# Patient Record
Sex: Female | Born: 2000 | Race: Black or African American | Hispanic: No | Marital: Single | Smoking: Never smoker
Health system: Southern US, Community
[De-identification: ages and names within clinical notes are randomized; demographics above are authoritative.]

## PROBLEM LIST (undated history)

## (undated) DIAGNOSIS — J45909 Unspecified asthma, uncomplicated: Secondary | ICD-10-CM

## (undated) DIAGNOSIS — I10 Essential (primary) hypertension: Secondary | ICD-10-CM

## (undated) DIAGNOSIS — A4902 Methicillin resistant Staphylococcus aureus infection, unspecified site: Secondary | ICD-10-CM

## (undated) DIAGNOSIS — D649 Anemia, unspecified: Secondary | ICD-10-CM

## (undated) DIAGNOSIS — J4540 Moderate persistent asthma, uncomplicated: Secondary | ICD-10-CM

## (undated) DIAGNOSIS — D500 Iron deficiency anemia secondary to blood loss (chronic): Secondary | ICD-10-CM

## (undated) DIAGNOSIS — Q249 Congenital malformation of heart, unspecified: Secondary | ICD-10-CM

## (undated) HISTORY — DX: Anemia, unspecified: D64.9

## (undated) HISTORY — DX: Essential (primary) hypertension: I10

## (undated) HISTORY — DX: Iron deficiency anemia secondary to blood loss (chronic): D50.0

## (undated) HISTORY — DX: Moderate persistent asthma, uncomplicated: J45.40

---
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
## 2020-01-19 ENCOUNTER — Other Ambulatory Visit: Payer: Self-pay

## 2020-01-19 ENCOUNTER — Encounter (HOSPITAL_COMMUNITY): Payer: Self-pay

## 2020-01-19 ENCOUNTER — Ambulatory Visit (HOSPITAL_COMMUNITY)
Admission: EM | Admit: 2020-01-19 | Discharge: 2020-01-19 | Disposition: A | Discharge status: Home / Self Care | Payer: Medicaid Other

## 2020-01-19 ENCOUNTER — Ambulatory Visit (INDEPENDENT_AMBULATORY_CARE_PROVIDER_SITE_OTHER): Payer: Medicaid Other

## 2020-01-19 DIAGNOSIS — M79644 Pain in right finger(s): Secondary | ICD-10-CM

## 2020-01-19 DIAGNOSIS — S6991XA Unspecified injury of right wrist, hand and finger(s), initial encounter: Secondary | ICD-10-CM

## 2020-01-19 DIAGNOSIS — S60111A Contusion of right thumb with damage to nail, initial encounter: Secondary | ICD-10-CM

## 2020-01-19 NOTE — ED Triage Notes (Signed)
Pt presents to UC with swelling and pain in the right thumb, black nail in the right thumb x 1 day, after she smashed the finger with a door.

## 2020-01-19 NOTE — ED Provider Notes (Signed)
[facility]    CSN: [id number] Arrival date & time: 01/19/20  1118      History   Chief Complaint Chief Complaint  Patient presents with  . Finger Injury    HPI Mackenzie Berger is a 19 y.o. female.   Patient reports for evaluation of right thumb injury.  She reports yesterday she was loading groceries into her car and she slammed car door all the end of her right thumb.  She reports since then has been swelling and discoloration of her nail.  She reports has been quite painful.  She denies ability to move the thumb due to pain.  She reports pain has been starting to move proximal in the thumb.  Denies pain elsewhere in her hand.  Assures me that only the end of the right thumb was caught on the door and then no other parts of her hand were in the door.  Denies any numbness or tingling in thumb.  No previous injuries.  This occurred around 3 PM on 01/18/2020.     Past Medical History:  Diagnosis Date  . Asthma   . 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  Marland Kitchen MRSA (methicillin resistant Staphylococcus aureus)     Patient Active Problem List   Diagnosis Date Noted  . Pelvic pain in female 09/12/2019  . Abnormal uterine bleeding (AUB) 01/09/2018  . Iron deficiency anemia due to chronic blood loss 03/11/2017  . Heavy menses 03/11/2017  . Syncope 03/11/2017  . Anemia 03/08/2017    History reviewed. No pertinent surgical history.  OB History    Gravida  0   Para  0   Term  0   Preterm  0   AB  0   Living  0     SAB  0   TAB  0   Ectopic  0   Multiple  0   Live Births  0            Home Medications    Prior to Admission medications   Medication Sig Start Date End Date Taking? Authorizing Provider  montelukast (SINGULAIR) 10 MG tablet Take 10 mg by mouth at bedtime.   Yes [provider]    albuterol (PROVENTIL HFA;VENTOLIN HFA) 108 (90 Base) MCG/ACT inhaler Inhale 2 puffs into the lungs every 6 (six) hours as needed for wheezing or shortness of breath.    [provider]  albuterol (PROVENTIL) (2.5 MG/3ML) 0.083% nebulizer solution Take 2.5 mg by nebulization every 6 (six) hours as needed for wheezing or shortness of breath.    [provider]  cetirizine (ZYRTEC ALLERGY) 10 MG tablet Take 1 tablet (10 mg total) by mouth daily as needed for allergies or rhinitis. 12/02/19   Petrucelli, Pleas Koch, PA-C  Elagolix Sodium (ORILISSA) 150 MG TABS Take 1 tablet by mouth every morning. Patient taking differently: Take 1 tablet by mouth daily.  09/12/19   Adam Phenix, MD  ferrous sulfate 325 (65 FE) MG tablet Take 1 tablet (325 mg total) by mouth 3 (three) times daily with meals. 03/11/17   Marca Ancona, MD  IBU 600 MG tablet TAKE ONE (1) TABLET BY MOUTH EVERY 6 HOURS AS NEEDED Patient taking differently: Take 600 mg by mouth every 6 (six) hours as needed for fever, headache or mild pain.  07/01/17   Adam Phenix, MD  Olopatadine HCl 0.2 % SOLN Place 1 drop into both eyes daily as needed (for allergies).  11/19/19   [provider]  predniSONE (STERAPRED UNI-PAK 21 TAB) 10 MG (21) TBPK tablet Take by mouth daily. 6,5,4,3,2,1 12/02/19   Petrucelli, Samantha R, PA-C  Norethindrone Acetate-Ethinyl Estradiol (LOESTRIN 1.5/30, 21,) 1.5-30 MG-MCG tablet Take 1 tablet by mouth daily. Patient not taking: Reported on 09/12/2019 01/09/18 12/02/19  Orvilla Cornwall A, CNM  Norgestimate-Ethinyl Estradiol Triphasic (TRI-SPRINTEC) 0.18/0.215/0.25 MG-35 MCG tablet Take 3 tablets by mouth daily for 3 days. Then, take 2 tablets daily for 3 days. Then, take 1 tablet daily until you get to the fourth row of the medicine. Do not take any of the tablets on the last row of the medication. Patient not taking: Reported on 12/02/2019 07/20/19 12/02/19  Barkley Boards, PA-C    Family  History Family History  Problem Relation Age of Onset  . Asthma Mother   . Diabetes Mother     Social History Social History   Tobacco Use  . Smoking status: Passive Smoke Exposure - Never Smoker  . Smokeless tobacco: Never Used  . Tobacco comment: mother and brothers smoke in the home  Vaping Use  . Vaping Use: Never used  Substance Use Topics  . Alcohol use: No  . Drug use: No     Allergies   Patient has no known allergies.   Review of Systems Review of Systems   Physical Exam Triage Vital Signs ED Triage Vitals  Enc Vitals Group     BP 01/19/20 1130 136/74     Pulse Rate 01/19/20 1130 86     Resp 01/19/20 1130 18     Temp 01/19/20 1130 98.6 F (37 C)     Temp Source 01/19/20 1130 Oral     SpO2 01/19/20 1130 99 %     Weight --      Height --      Head Circumference --      Peak Flow --      Pain Score 01/19/20 1132 10     Pain Loc --      Pain Edu? --      Excl. in GC? --    No data found.  Updated Vital Signs BP 136/74 (BP Location: Right Arm)   Pulse 86   Temp 98.6 F (37 C) (Oral)   Resp 18   LMP 01/05/2020 (Exact Date) Comment: 2 weeks  SpO2 99%   Visual Acuity Right Eye Distance:   Left Eye Distance:   Bilateral Distance:    Right Eye Near:   Left Eye Near:    Bilateral Near:     Physical Exam Vitals and nursing note reviewed.  Constitutional:      General: She is not in acute distress.    Appearance: Normal appearance.  Musculoskeletal:     Comments: Right thumb with subungual hematoma.  There is swelling at the DIP.  Tenderness to palpation throughout the distal phalanx.  Some tenderness in the proximal phalanx.  No significant tenderness over the first MCP joint.  Patient able to move the MCP joint however pain limits range of motion of the distal phalangeal joint.  There is no bony tenderness in the proximal hand or wrist.  No metacarpal tenderness throughout.  No tenderness in the other digits.  Patient able to make a fist  minus her thumb in the right hand.  Cap refill less than  2 seconds sensation intact in the right thumb.  Skin:    General: Skin is warm and dry.  Neurological:     Mental Status: She is alert.      UC Treatments / Results  Labs (all labs ordered are listed, but only abnormal results are displayed) Labs Reviewed - No data to display  EKG   Radiology DG Finger Thumb Right  Result Date: 01/19/2020 CLINICAL DATA:  Pain after trauma EXAM: RIGHT THUMB 2+V COMPARISON:  None. FINDINGS: There is no evidence of fracture or dislocation. There is no evidence of arthropathy or other focal bone abnormality. Soft tissues are unremarkable. IMPRESSION: Negative. Electronically Signed   By: Dorise Bullion III M.D   On: 01/19/2020 13:23    Procedures Procedures (including critical care time) Subungual hematoma drained with electrocautery pen.  2 holes made adjacent to one another on the right thumb.  Good drainage.  Patient tolerated this well.  Nail was cleansed with iodine prior to procedure.  Medications Ordered in UC Medications - No data to display  Initial Impression / Assessment and Plan / UC Course  I have reviewed the triage vital signs and the nursing notes.  Pertinent labs & imaging results that were available during my care of the patient were reviewed by me and considered in my medical decision making (see chart for details).     #Subungual hematoma #Right thumb injury Patient is a 19 year old presenting with right thumb injury and a subungual hematoma on the right thumb.  Successful trephination.  No acute fracture visible on x-ray however given mechanism and limited range of motion today will place in a thumb spica both for protection as well as splinting.  We will have her follow-up with sports medicine next week for reevaluation to ensure there is no ligamentous or delayed findings of fractures.  Discussed ibuprofen and Tylenol for pain management.  Patient verbalized  understanding the plan.   Final Clinical Impressions(s) / UC Diagnoses   Final diagnoses:  Subungual hematoma of right thumb, initial encounter  Injury of right thumb, initial encounter     Discharge Instructions     Use the brace to protect the thumb and for comfort  Schedule follow up with Sports medicine in 1 week for re-evaluation  take 2 regular strength Advil/ibuprofen and 2 regular strength Tylenol every 6-8 hours for pain.  You may ice the thumb as well.      ED Prescriptions    None     PDMP not reviewed this encounter.   Purnell Shoemaker, PA-C 01/19/20 1932

## 2020-01-19 NOTE — Discharge Instructions (Addendum)
Use the brace to protect the thumb and for comfort  Schedule follow up with Sports medicine in 1 week for re-evaluation  take 2 regular strength Advil/ibuprofen and 2 regular strength Tylenol every 6-8 hours for pain.  You may ice the thumb as well.

## 2020-02-01 ENCOUNTER — Ambulatory Visit: Payer: Medicaid Other | Admitting: Family Medicine

## 2020-02-01 ENCOUNTER — Other Ambulatory Visit: Payer: Self-pay

## 2020-04-20 ENCOUNTER — Other Ambulatory Visit: Payer: Self-pay | Admitting: Obstetrics & Gynecology

## 2020-04-20 DIAGNOSIS — N920 Excessive and frequent menstruation with regular cycle: Secondary | ICD-10-CM

## 2020-07-17 ENCOUNTER — Telehealth: Payer: Self-pay

## 2020-07-17 NOTE — Telephone Encounter (Signed)
Returned call, pt wanting refill on Orilissa, advised that per provider note from Feb 2021 she was suppose to return to office in 3 months, advised pt that I will send message to provider for review but she needs to schedule an appointment.

## 2020-07-18 ENCOUNTER — Other Ambulatory Visit: Payer: Self-pay | Admitting: Obstetrics & Gynecology

## 2020-07-18 DIAGNOSIS — N920 Excessive and frequent menstruation with regular cycle: Secondary | ICD-10-CM

## 2020-07-18 MED ORDER — ORILISSA 150 MG PO TABS: 1.0000 | ORAL_TABLET | Freq: Every day | ORAL | 1 refills | Status: DC

## 2020-07-18 NOTE — Progress Notes (Signed)
Meds ordered this encounter  Medications  . Elagolix Sodium (ORILISSA) 150 MG TABS    Sig: Take 1 tablet by mouth daily.    Dispense:  30 tablet    Refill:  1

## 2021-01-22 ENCOUNTER — Other Ambulatory Visit: Payer: Self-pay

## 2021-01-22 ENCOUNTER — Ambulatory Visit (HOSPITAL_COMMUNITY)
Admission: EM | Admit: 2021-01-22 | Discharge: 2021-01-22 | Disposition: A | Discharge status: Home / Self Care | Payer: Medicaid Other | Attending: Physician Assistant | Admitting: Physician Assistant

## 2021-01-22 ENCOUNTER — Encounter (HOSPITAL_COMMUNITY): Payer: Self-pay

## 2021-01-22 DIAGNOSIS — R0602 Shortness of breath: Secondary | ICD-10-CM

## 2021-01-22 DIAGNOSIS — J4521 Mild intermittent asthma with (acute) exacerbation: Secondary | ICD-10-CM | POA: Diagnosis not present

## 2021-01-22 DIAGNOSIS — R059 Cough, unspecified: Secondary | ICD-10-CM

## 2021-01-22 MED ORDER — BENZONATATE 100 MG PO CAPS: 100.0000 mg | ORAL_CAPSULE | Freq: Three times a day (TID) | ORAL | 0 refills | Status: DC

## 2021-01-22 MED ORDER — FLUTICASONE-SALMETEROL 250-50 MCG/ACT IN AEPB: 1.0000 | INHALATION_SPRAY | Freq: Two times a day (BID) | RESPIRATORY_TRACT | 0 refills | Status: AC

## 2021-01-22 MED ORDER — PREDNISONE 10 MG (21) PO TBPK: ORAL_TABLET | ORAL | 0 refills | Status: DC

## 2021-01-22 NOTE — ED Triage Notes (Signed)
Patient presents to Urgent Care with complaints of asthma flare-up and productive cough x 3 weeks ago. Pt treating her wheezing and SOB with her albuterol with no relief. She was dx with COVID a month ago.   Denies fever.

## 2021-01-22 NOTE — Discharge Instructions (Signed)
We are not starting any antibiotics today.  Take prednisone as directed.  Do not take NSAIDs including aspirin, ibuprofen/Advil, naproxen/Aleve with prednisone as it can cause stomach bleeding.  Use Advair (fluticasone/salmeterol) twice a day.  Make sure to rinse your mouth following use of this medication.  Use your albuterol inhaler as needed for acute symptoms.  If you have any worsening symptoms please return for reevaluation.  Follow-up with your PCP as scheduled next week.

## 2021-01-22 NOTE — ED Provider Notes (Signed)
[facility]    CSN: [id number] Arrival date & time: 01/22/21  1315      History   Chief Complaint Chief Complaint  Patient presents with   Asthma   Cough    HPI Mackenzie Berger is a 20 y.o. female.   Patient presents today with a several week history of worsening shortness of breath and cough.  She has a history of asthma and has been using albuterol inhaler more frequently prompting evaluation.  Reports that she tested positive for COVID approximately 1 month ago and has had resolution of symptoms with the exception of ongoing asthma symptoms.  She does report cough but denies any significant sputum production.  She has not tried any over-the-counter medications for symptom management.  She denies taking maintenance medication in the past.  She was given a prednisone taper immediately after being diagnosed with COVID which provided temporary relief of symptoms.  Denies any additional steroid use since that time.  She denies history of diabetes.  She does not smoke cigarettes.  She has been hospitalized for asthma in the past but denies previous intubation.  Reports symptoms are causing her to wake up at night and preventing her from performing daily activities.   Past Medical History:  Diagnosis Date   Asthma    Heart abnormality    per mom at times her heart will race-when she was born she had be on digoxin-lately it is bothering her more-we are from [place] and don't have a cardiologist yet.   MRSA (methicillin resistant Staphylococcus aureus)     Patient Active Problem List   Diagnosis Date Noted   Pelvic pain in female 09/12/2019   Abnormal uterine bleeding (AUB) 01/09/2018   Iron deficiency anemia due to chronic blood loss 03/11/2017   Heavy menses 03/11/2017   Syncope 03/11/2017   Anemia 03/08/2017    History reviewed. No pertinent surgical history.  OB History     Gravida  0   Para  0   Term  0   Preterm  0   AB  0   Living  0      SAB  0    IAB  0   Ectopic  0   Multiple  0   Live Births  0            Home Medications    Prior to Admission medications   Medication Sig Start Date End Date Taking? Authorizing Provider  benzonatate (TESSALON) 100 MG capsule Take 1 capsule (100 mg total) by mouth every 8 (eight) hours. 01/22/21  Yes Bernice Mcauliffe K, PA-C  fluticasone-salmeterol (ADVAIR) 250-50 MCG/ACT AEPB Inhale 1 puff into the lungs in the morning and at bedtime. 01/22/21  Yes Soren Pigman K, PA-C  predniSONE (STERAPRED UNI-PAK 21 TAB) 10 MG (21) TBPK tablet As directed 01/22/21  Yes Aeliana Spates K, PA-C  albuterol (PROVENTIL HFA;VENTOLIN HFA) 108 (90 Base) MCG/ACT inhaler Inhale 2 puffs into the lungs every 6 (six) hours as needed for wheezing or shortness of breath.    [provider]  albuterol (PROVENTIL) (2.5 MG/3ML) 0.083% nebulizer solution Take 2.5 mg by nebulization every 6 (six) hours as needed for wheezing or shortness of breath.    [provider]  cetirizine (ZYRTEC ALLERGY) 10 MG tablet Take 1 tablet (10 mg total) by mouth daily as needed for allergies or rhinitis. 12/02/19   Petrucelli, Pleas Koch, PA-C  Elagolix Sodium (ORILISSA) 150 MG TABS Take 1 tablet by mouth daily. 07/18/20  Adam Phenix, MD  ferrous sulfate 325 (65 FE) MG tablet Take 1 tablet (325 mg total) by mouth 3 (three) times daily with meals. 03/11/17   Marca Ancona, MD  IBU 600 MG tablet TAKE ONE (1) TABLET BY MOUTH EVERY 6 HOURS AS NEEDED Patient taking differently: Take 600 mg by mouth every 6 (six) hours as needed for fever, headache or mild pain.  07/01/17   Adam Phenix, MD  montelukast (SINGULAIR) 10 MG tablet Take 10 mg by mouth at bedtime.    [provider]  Olopatadine HCl 0.2 % SOLN Place 1 drop into both eyes daily as needed (for allergies).  11/19/19   [provider]  Norethindrone Acetate-Ethinyl Estradiol (LOESTRIN 1.5/30, 21,) 1.5-30 MG-MCG tablet Take 1 tablet by mouth daily. Patient  not taking: Reported on 09/12/2019 01/09/18 12/02/19  Orvilla Cornwall A, CNM  Norgestimate-Ethinyl Estradiol Triphasic (TRI-SPRINTEC) 0.18/0.215/0.25 MG-35 MCG tablet Take 3 tablets by mouth daily for 3 days. Then, take 2 tablets daily for 3 days. Then, take 1 tablet daily until you get to the fourth row of the medicine. Do not take any of the tablets on the last row of the medication. Patient not taking: Reported on 12/02/2019 07/20/19 12/02/19  Barkley Boards, PA-C    Family History Family History  Problem Relation Age of Onset   Asthma Mother    Diabetes Mother     Social History Social History   Tobacco Use   Smoking status: Passive Smoke Exposure - Never Smoker   Smokeless tobacco: Never   Tobacco comments:    mother and brothers smoke in the home  Vaping Use   Vaping Use: Never used  Substance Use Topics   Alcohol use: No   Drug use: No     Allergies   Patient has no known allergies.   Review of Systems Review of Systems  Constitutional:  Positive for activity change. Negative for appetite change, fatigue and fever.  HENT:  Negative for congestion, sinus pressure, sneezing and sore throat.   Respiratory:  Positive for cough, chest tightness, shortness of breath and wheezing.   Cardiovascular:  Negative for chest pain.  Gastrointestinal:  Negative for abdominal pain, diarrhea, nausea and vomiting.  Neurological:  Negative for dizziness, light-headedness and headaches.    Physical Exam Triage Vital Signs ED Triage Vitals  Enc Vitals Group     BP 01/22/21 1425 136/89     Pulse Rate 01/22/21 1425 82     Resp 01/22/21 1425 16     Temp 01/22/21 1425 99 F (37.2 C)     Temp Source 01/22/21 1425 Oral     SpO2 01/22/21 1425 96 %     Weight --      Height --      Head Circumference --      Peak Flow --      Pain Score 01/22/21 1427 0     Pain Loc --      Pain Edu? --      Excl. in [place]? --    No data found.  Updated Vital Signs BP 136/89 (BP Location: Right Arm)    Pulse 82   Temp 99 F (37.2 C) (Oral)   Resp 16   LMP 01/15/2021   SpO2 96%   Visual Acuity Right Eye Distance:   Left Eye Distance:   Bilateral Distance:    Right Eye Near:   Left Eye Near:    Bilateral Near:  Physical Exam Vitals reviewed.  Constitutional:      General: She is awake. She is not in acute distress.    Appearance: Normal appearance. She is normal weight. She is not ill-appearing.     Comments: Very pleasant female appears stated age no acute distress  HENT:     Head: Normocephalic and atraumatic.     Right Ear: Tympanic membrane, ear canal and external ear normal. Tympanic membrane is not erythematous or bulging.     Left Ear: Tympanic membrane, ear canal and external ear normal. Tympanic membrane is not erythematous or bulging.     Nose:     Right Sinus: No maxillary sinus tenderness or frontal sinus tenderness.     Left Sinus: No maxillary sinus tenderness or frontal sinus tenderness.     Mouth/Throat:     Pharynx: Uvula midline. No oropharyngeal exudate or posterior oropharyngeal erythema.  Cardiovascular:     Rate and Rhythm: Normal rate and regular rhythm.     Heart sounds: Normal heart sounds, S1 normal and S2 normal. No murmur heard. Pulmonary:     Effort: Pulmonary effort is normal.     Breath sounds: Wheezing present. No rhonchi or rales.     Comments: Widespread wheezing throughout lung fields. Lymphadenopathy:     Head:     Right side of head: No submental, submandibular or tonsillar adenopathy.     Left side of head: No submental, submandibular or tonsillar adenopathy.     Cervical: No cervical adenopathy.  Psychiatric:        Behavior: Behavior is cooperative.     UC Treatments / Results  Labs (all labs ordered are listed, but only abnormal results are displayed) Labs Reviewed - No data to display  EKG   Radiology No results found.  Procedures Procedures (including critical care time)  Medications Ordered in  UC Medications - No data to display  Initial Impression / Assessment and Plan / UC Course  I have reviewed the triage vital signs and the nursing notes.  Pertinent labs & imaging results that were available during my care of the patient were reviewed by me and considered in my medical decision making (see chart for details).      No evidence of acute infection that would warrant initiation of antibiotics.  Concern for asthma flare given persistent symptoms despite regular use of rescue medication.  Patient was offered steroid injection but declined this due to fear of needles.  She was started on prednisone taper with instruction not to take NSAIDs with this medication due to risk of GI bleeding.  She was prescribed Tessalon for cough as needed.  She was prescribed maintenance medication of Advair with instruction to rinse her mouth following use of this medication to prevent thrush.  She is to continue her allergy medication as previously prescribed.  She has follow-up scheduled with PCP next week, strongly encouraged to keep this appointment.  Discussed alarm symptoms or warrant emergent evaluation.  Strict return precautions given to which patient expressed understanding.  Final Clinical Impressions(s) / UC Diagnoses   Final diagnoses:  Mild intermittent asthma with acute exacerbation  Shortness of breath  Cough     Discharge Instructions      We are not starting any antibiotics today.  Take prednisone as directed.  Do not take NSAIDs including aspirin, ibuprofen/Advil, naproxen/Aleve with prednisone as it can cause stomach bleeding.  Use Advair (fluticasone/salmeterol) twice a day.  Make sure to rinse your mouth following use of  this medication.  Use your albuterol inhaler as needed for acute symptoms.  If you have any worsening symptoms please return for reevaluation.  Follow-up with your PCP as scheduled next week.     ED Prescriptions     Medication Sig Dispense Auth. Provider    predniSONE (STERAPRED UNI-PAK 21 TAB) 10 MG (21) TBPK tablet As directed 21 tablet Aven Christen K, PA-C   benzonatate (TESSALON) 100 MG capsule Take 1 capsule (100 mg total) by mouth every 8 (eight) hours. 21 capsule Maille Halliwell K, PA-C   fluticasone-salmeterol (ADVAIR) 250-50 MCG/ACT AEPB Inhale 1 puff into the lungs in the morning and at bedtime. 60 each Jasime Westergren, Noberto RetortErin K, PA-C      PDMP not reviewed this encounter.   Jeani HawkingRaspet, Yumiko Alkins K, PA-C 01/22/21 1445

## 2021-03-11 ENCOUNTER — Ambulatory Visit (INDEPENDENT_AMBULATORY_CARE_PROVIDER_SITE_OTHER): Payer: Medicaid Other | Admitting: Obstetrics and Gynecology

## 2021-03-11 ENCOUNTER — Encounter: Payer: Self-pay | Admitting: Obstetrics and Gynecology

## 2021-03-11 ENCOUNTER — Other Ambulatory Visit: Payer: Self-pay

## 2021-03-11 VITALS — BP 143/78 | HR 89 | Ht 63.0 in | Wt 182.0 lb

## 2021-03-11 DIAGNOSIS — N920 Excessive and frequent menstruation with regular cycle: Secondary | ICD-10-CM

## 2021-03-11 DIAGNOSIS — Z01419 Encounter for gynecological examination (general) (routine) without abnormal findings: Secondary | ICD-10-CM | POA: Diagnosis not present

## 2021-03-11 NOTE — Progress Notes (Signed)
GYNECOLOGY ANNUAL PREVENTATIVE CARE ENCOUNTER NOTE  History:     Mackenzie Berger is a 20 y.o. G0P0000 female here for a routine annual gynecologic exam.  Current complaints: none.   Denies abnormal vaginal bleeding, discharge, pelvic pain, problems with intercourse or other gynecologic concerns.  Pt notes regular menses lasting 7 days.  Menses heavy 4-5 days.  Pt had hx of heavy bleeding, she states this has somewhat improved.  She declines STD testing today.  Pt starting college in a few weeks.   Gynecologic History Patient's last menstrual period was 03/03/2021. Contraception: none Last Pap: n/a. Due to age  Obstetric History OB History  Gravida Para Term Preterm AB Living  0 0 0 0 0 0  SAB IAB Ectopic Multiple Live Births  0 0 0 0 0    Past Medical History:  Diagnosis Date   Asthma    Heart abnormality    per mom at times her heart will race-when she was born she had be on digoxin-lately it is bothering her more-we are from [place] and don't have a cardiologist yet.   MRSA (methicillin resistant Staphylococcus aureus)     History reviewed. No pertinent surgical history.  Current Outpatient Medications on File Prior to Visit  Medication Sig Dispense Refill   albuterol (PROVENTIL) (2.5 MG/3ML) 0.083% nebulizer solution Take 2.5 mg by nebulization every 6 (six) hours as needed for wheezing or shortness of breath.     cetirizine (ZYRTEC ALLERGY) 10 MG tablet Take 1 tablet (10 mg total) by mouth daily as needed for allergies or rhinitis. 15 tablet 0   ferrous sulfate 325 (65 FE) MG tablet Take 1 tablet (325 mg total) by mouth 3 (three) times daily with meals. 90 tablet 3   fluticasone-salmeterol (ADVAIR) 250-50 MCG/ACT AEPB Inhale 1 puff into the lungs in the morning and at bedtime. 60 each 0   IBU 600 MG tablet TAKE ONE (1) TABLET BY MOUTH EVERY 6 HOURS AS NEEDED (Patient taking differently: Take 600 mg by mouth every 6 (six) hours as needed for fever, headache or mild pain.) 30  tablet 1   albuterol (PROVENTIL HFA;VENTOLIN HFA) 108 (90 Base) MCG/ACT inhaler Inhale 2 puffs into the lungs every 6 (six) hours as needed for wheezing or shortness of breath.     benzonatate (TESSALON) 100 MG capsule Take 1 capsule (100 mg total) by mouth every 8 (eight) hours. 21 capsule 0   Elagolix Sodium (ORILISSA) 150 MG TABS Take 1 tablet by mouth daily. 30 tablet 1   montelukast (SINGULAIR) 10 MG tablet Take 10 mg by mouth at bedtime.     Olopatadine HCl 0.2 % SOLN Place 1 drop into both eyes daily as needed (for allergies).      [DISCONTINUED] Norethindrone Acetate-Ethinyl Estradiol (LOESTRIN 1.5/30, 21,) 1.5-30 MG-MCG tablet Take 1 tablet by mouth daily. (Patient not taking: Reported on 09/12/2019) 1 Package 11   [DISCONTINUED] Norgestimate-Ethinyl Estradiol Triphasic (TRI-SPRINTEC) 0.18/0.215/0.25 MG-35 MCG tablet Take 3 tablets by mouth daily for 3 days. Then, take 2 tablets daily for 3 days. Then, take 1 tablet daily until you get to the fourth row of the medicine. Do not take any of the tablets on the last row of the medication. (Patient not taking: Reported on 12/02/2019) 1 Package 0   No current facility-administered medications on file prior to visit.    No Known Allergies  Social History:  reports that she has never smoked. She has been exposed to tobacco smoke. She has never  used smokeless tobacco. She reports that she does not drink alcohol and does not use drugs.  Family History  Problem Relation Age of Onset   Asthma Mother    Diabetes Mother     The following portions of the patient's history were reviewed and updated as appropriate: allergies, current medications, past family history, past medical history, past social history, past surgical history and problem list.  Review of Systems Pertinent items noted in HPI and remainder of comprehensive ROS otherwise negative.  Physical Exam:  BP (!) 143/78   Pulse 89   Ht 5\' 3"  (1.6 m)   Wt 182 lb (82.6 kg)   LMP  03/03/2021   BMI 32.24 kg/m  CONSTITUTIONAL: Well-developed, well-nourished female in no acute distress.  HENT:  Normocephalic, atraumatic, External right and left ear normal. Oropharynx is clear and moist EYES: Conjunctivae and EOM are normal.  NECK: Normal range of motion, supple, no masses.  Normal thyroid.  SKIN: Skin is warm and dry. No rash noted. Not diaphoretic. No erythema. No pallor. MUSCULOSKELETAL: Normal range of motion. No tenderness.  No cyanosis, clubbing, or edema.  2+ distal pulses. NEUROLOGIC: Alert and oriented to person, place, and time. Normal reflexes, muscle tone coordination.  PSYCHIATRIC: Normal mood and affect. Normal behavior. Normal judgment and thought content. CARDIOVASCULAR: Normal heart rate noted, regular rhythm RESPIRATORY: Clear to auscultation bilaterally. Effort and breath sounds normal, no problems with respiration noted. BREASTS: deferred ABDOMEN: Soft, no distention noted.  No tenderness, rebound or guarding.  PELVIC: Normal appearing external genitalia and urethral meatus; normal appearing vaginal mucosa and cervix.  No abnormal discharge noted.   Normal uterine size, no other palpable masses, no uterine or adnexal tenderness.  Performed in the presence of a chaperone.   Assessment and Plan:    1. Menorrhagia with regular cycle Discussed options including expectant management, trial of lysteda, trial of ocp to decrease duration and intensity of menses.  Per pt she used ocps x2-3 years which seemed to cause more irregular bleeding and she didn't like the way the hormones made her feel.  Pt will pursue expectant management.  2. Women's annual routine gynecological examination Routine exam, pap in 1 year, per pt request no STD check, she is not sexually active.  Routine preventative health maintenance measures emphasized. Please refer to After Visit Summary for other counseling recommendations.      05/03/2021, MD, FACOG Obstetrician &  Gynecologist, [facility], [facility]

## 2021-03-20 IMAGING — DX DG FINGER THUMB 2+V*R*
3 series · 3 of 3 positions shown · non-contrast
Comparison: None.

CLINICAL DATA: Pain after trauma

EXAM:
RIGHT THUMB 2+V

[finger ap]
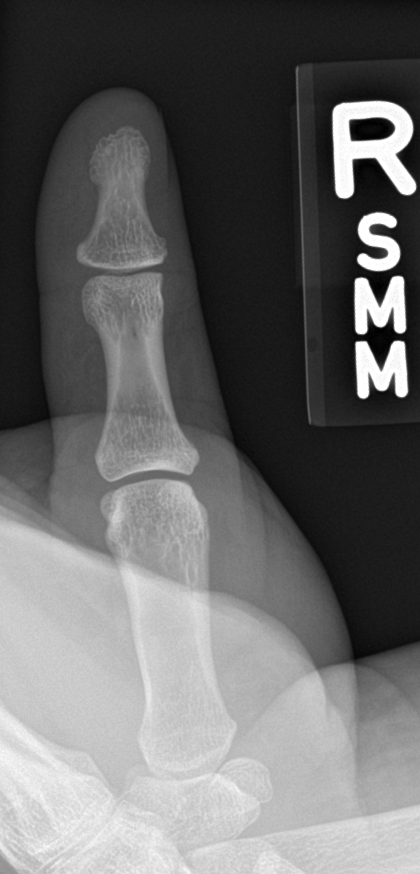

[finger obl]
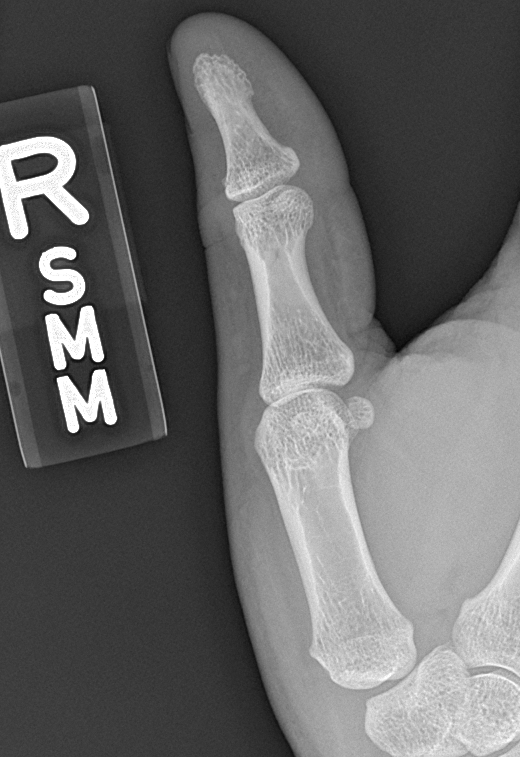

[finger lat]
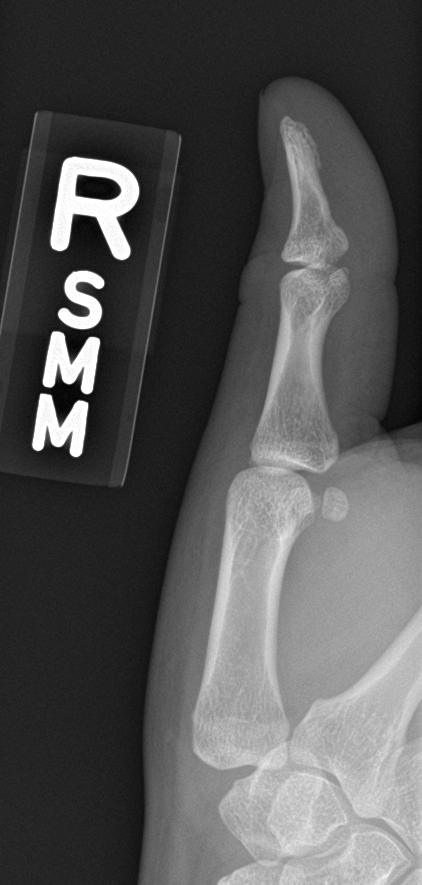

[3 of 3 positions shown; findings below may reference images not displayed]

FINDINGS: There is no evidence of fracture or dislocation. There is no
evidence of arthropathy or other focal bone abnormality. Soft
tissues are unremarkable.
IMPRESSION: Negative.

## 2022-04-20 ENCOUNTER — Ambulatory Visit (HOSPITAL_COMMUNITY)
Admission: EM | Admit: 2022-04-20 | Discharge: 2022-04-20 | Disposition: A | Discharge status: Home / Self Care | Payer: Medicaid Other | Attending: Emergency Medicine | Admitting: Emergency Medicine

## 2022-04-20 ENCOUNTER — Encounter (HOSPITAL_COMMUNITY): Payer: Self-pay | Admitting: Emergency Medicine

## 2022-04-20 DIAGNOSIS — N946 Dysmenorrhea, unspecified: Secondary | ICD-10-CM | POA: Diagnosis not present

## 2022-04-20 DIAGNOSIS — Z8742 Personal history of other diseases of the female genital tract: Secondary | ICD-10-CM

## 2022-04-20 MED ORDER — ACETAMINOPHEN 325 MG PO TABS
975.0000 mg | ORAL_TABLET | Freq: Once | ORAL | Status: AC
  Administered 2022-04-20: 975 mg via ORAL

## 2022-04-20 MED ORDER — ACETAMINOPHEN 325 MG PO TABS
ORAL_TABLET | ORAL | Status: AC
  Filled 2022-04-20: qty 3

## 2022-04-20 NOTE — ED Triage Notes (Signed)
Pt presents to UC for severe menstrual cramps x 1 day.  States she does have painful menstrual cycles but this time pain is severe. States has been taking Ibuprofen 600 mg. Last dose was around 6 am. Menstrual cycle started yesterday.

## 2022-04-20 NOTE — ED Provider Notes (Signed)
[facility]    CSN: [id number] Arrival date & time: 04/20/22  0941      History   Chief Complaint Chief Complaint  Patient presents with   Menstrual Cramps    HPI Mackenzie Berger is a 21 y.o. female.  Presents with 2-day history of menstrual cramps. Her cycle began yesterday, history of painful and heavy cycles. Has been trying 600 mg ibuprofen.  Last dose was about 3 hours before presentation to urgent care.  No clots or large tissue No n/v/d  She denies possibility of pregnancy, reports she has never been sexually active  Had an ob/gyn last year but has not seen them, looking for a new one  Past Medical History:  Diagnosis Date   Asthma    Heart abnormality    per mom at times her heart will race-when she was born she had be on digoxin-lately it is bothering her more-we are from [place] and don't have a cardiologist yet.   MRSA (methicillin resistant Staphylococcus aureus)     Patient Active Problem List   Diagnosis Date Noted   Women's annual routine gynecological examination 03/11/2021   Pelvic pain in female 09/12/2019   Abnormal uterine bleeding (AUB) 01/09/2018   Iron deficiency anemia due to chronic blood loss 03/11/2017   Heavy menses 03/11/2017   Syncope 03/11/2017   Anemia 03/08/2017    History reviewed. No pertinent surgical history.  OB History     Gravida  0   Para  0   Term  0   Preterm  0   AB  0   Living  0      SAB  0   IAB  0   Ectopic  0   Multiple  0   Live Births  0            Home Medications    Prior to Admission medications   Medication Sig Start Date End Date Taking? Authorizing Provider  albuterol (PROVENTIL HFA;VENTOLIN HFA) 108 (90 Base) MCG/ACT inhaler Inhale 2 puffs into the lungs every 6 (six) hours as needed for wheezing or shortness of breath.    [provider]  albuterol (PROVENTIL) (2.5 MG/3ML) 0.083% nebulizer solution Take 2.5 mg by nebulization every 6 (six) hours as needed for  wheezing or shortness of breath.    [provider]  benzonatate (TESSALON) 100 MG capsule Take 1 capsule (100 mg total) by mouth every 8 (eight) hours. 01/22/21   Raspet, Derry Skill, PA-C  cetirizine (ZYRTEC ALLERGY) 10 MG tablet Take 1 tablet (10 mg total) by mouth daily as needed for allergies or rhinitis. 12/02/19   Petrucelli, Glynda Jaeger, PA-C  Elagolix Sodium (ORILISSA) 150 MG TABS Take 1 tablet by mouth daily. 07/18/20   Woodroe Mode, MD  ferrous sulfate 325 (65 FE) MG tablet Take 1 tablet (325 mg total) by mouth 3 (three) times daily with meals. 03/11/17   Jerolyn Shin, MD  fluticasone-salmeterol (ADVAIR) 250-50 MCG/ACT AEPB Inhale 1 puff into the lungs in the morning and at bedtime. 01/22/21   Raspet, Erin K, PA-C  IBU 600 MG tablet TAKE ONE (1) TABLET BY MOUTH EVERY 6 HOURS AS NEEDED Patient taking differently: Take 600 mg by mouth every 6 (six) hours as needed for fever, headache or mild pain. 07/01/17   Woodroe Mode, MD  montelukast (SINGULAIR) 10 MG tablet Take 10 mg by mouth at bedtime.    [provider]  Olopatadine HCl 0.2 % SOLN Place  1 drop into both eyes daily as needed (for allergies).  11/19/19   [provider]  Norethindrone Acetate-Ethinyl Estradiol (LOESTRIN 1.5/30, 21,) 1.5-30 MG-MCG tablet Take 1 tablet by mouth daily. Patient not taking: Reported on 09/12/2019 01/09/18 12/02/19  Kandis Cocking A, CNM  Norgestimate-Ethinyl Estradiol Triphasic (TRI-SPRINTEC) 0.18/0.215/0.25 MG-35 MCG tablet Take 3 tablets by mouth daily for 3 days. Then, take 2 tablets daily for 3 days. Then, take 1 tablet daily until you get to the fourth row of the medicine. Do not take any of the tablets on the last row of the medication. Patient not taking: Reported on 12/02/2019 07/20/19 12/02/19  Joanne Gavel, PA-C    Family History Family History  Problem Relation Age of Onset   Asthma Mother    Diabetes Mother     Social History Social History   Tobacco Use    Smoking status: Never    Passive exposure: Yes   Smokeless tobacco: Never   Tobacco comments:    mother and brothers smoke in the home  Vaping Use   Vaping Use: Never used  Substance Use Topics   Alcohol use: No   Drug use: No     Allergies   Patient has no known allergies.   Review of Systems Review of Systems  Per HPI  Physical Exam Triage Vital Signs ED Triage Vitals  Enc Vitals Group     BP 04/20/22 1036 (!) 154/97     Pulse Rate 04/20/22 1036 88     Resp 04/20/22 1036 18     Temp 04/20/22 1036 97.7 F (36.5 C)     Temp Source 04/20/22 1036 Oral     SpO2 04/20/22 1036 98 %     Weight --      Height --      Head Circumference --      Peak Flow --      Pain Score 04/20/22 1034 8     Pain Loc --      Pain Edu? --      Excl. in [place]? --    No data found.  Updated Vital Signs BP (!) 154/97 (BP Location: Right Arm)   Pulse 88   Temp 97.7 F (36.5 C) (Oral)   Resp 18   LMP 04/19/2022 (Exact Date)   SpO2 98%    Physical Exam Vitals and nursing note reviewed.  Constitutional:      Appearance: Normal appearance.  HENT:     Mouth/Throat:     Mouth: Mucous membranes are moist.     Pharynx: Oropharynx is clear.  Eyes:     Conjunctiva/sclera: Conjunctivae normal.  Cardiovascular:     Rate and Rhythm: Normal rate and regular rhythm.     Heart sounds: Normal heart sounds.  Pulmonary:     Effort: Pulmonary effort is normal. No respiratory distress.     Breath sounds: Normal breath sounds.  Abdominal:     General: Bowel sounds are normal.     Palpations: Abdomen is soft.     Tenderness: There is abdominal tenderness in the right lower quadrant, suprapubic area and left lower quadrant. There is no right CVA tenderness, left CVA tenderness, guarding or rebound.  Musculoskeletal:        General: Normal range of motion.  Skin:    General: Skin is warm and dry.  Neurological:     Mental Status: She is alert and oriented to person, place, and time.       UC Treatments /  Results  Labs (all labs ordered are listed, but only abnormal results are displayed) Labs Reviewed - No data to display  EKG   Radiology No results found.  Procedures Procedures (including critical care time)  Medications Ordered in UC Medications  acetaminophen (TYLENOL) tablet 975 mg (975 mg Oral Given 04/20/22 1046)    Initial Impression / Assessment and Plan / UC Course  I have reviewed the triage vital signs and the nursing notes.  Pertinent labs & imaging results that were available during my care of the patient were reviewed by me and considered in my medical decision making (see chart for details).  Tylenol dose given, some improvement in pain - started as 9/10, down to 6 or 7. Tender across low abdomen, no guarding Recommend alternate tylenol/ibu, hot pad, close follow up with ob/gyn Provided med center for women info, she will call today Return precautions discussed including ED precautions for worsening symptoms. Patient agrees to plan  Final Clinical Impressions(s) / UC Diagnoses   Final diagnoses:  Severe menstrual cramps  History of menstrual cramps     Discharge Instructions      I recommend to alternate ibuprofen 800 mg with Tylenol up to 1000 mg every 6 hours for pain.  Please call the women's clinic to set up an appointment for evaluation and management.  If at any point your symptoms worsen and are not controlled by medicine, please go to the emergency department    ED Prescriptions   None    PDMP not reviewed this encounter.   Shericka Johnstone, Wells Guiles, [place] 04/20/22 1233

## 2022-04-20 NOTE — Discharge Instructions (Addendum)
I recommend to alternate ibuprofen 800 mg with Tylenol up to 1000 mg every 6 hours for pain.  Please call the women's clinic to set up an appointment for evaluation and management.  If at any point your symptoms worsen and are not controlled by medicine, please go to the emergency department

## 2022-08-19 ENCOUNTER — Telehealth: Payer: Self-pay | Admitting: *Deleted

## 2022-08-19 NOTE — Telephone Encounter (Signed)
Returned TC to pt requesting med for yeast. Pt's last visit 03/2021. Advised pt that we are unable to send med by protocol for pt that has not been seen in >1 year. Advised to sched nurse visit for self swab and also annual exam. Pt verbalized understanding. Call transferred to schedulers.

## 2022-08-23 ENCOUNTER — Other Ambulatory Visit (HOSPITAL_COMMUNITY)
Admission: RE | Admit: 2022-08-23 | Discharge: 2022-08-23 | Disposition: A | Discharge status: Home / Self Care | Payer: Medicaid Other | Source: Ambulatory Visit | Attending: Obstetrics and Gynecology | Admitting: Obstetrics and Gynecology

## 2022-08-23 ENCOUNTER — Ambulatory Visit (INDEPENDENT_AMBULATORY_CARE_PROVIDER_SITE_OTHER): Payer: Medicaid Other

## 2022-08-23 VITALS — BP 135/72 | HR 63 | Ht 63.0 in | Wt 183.0 lb

## 2022-08-23 DIAGNOSIS — N898 Other specified noninflammatory disorders of vagina: Secondary | ICD-10-CM

## 2022-08-23 NOTE — Progress Notes (Addendum)
SUBJECTIVE:  22 y.o. female complains of white, creamy, foul, and thick vaginal discharge for 5 day(s). Denies abnormal vaginal bleeding or significant pelvic pain or fever. No UTI symptoms. Denies history of known exposure to STD.  Patient's last menstrual period was 08/07/2022 (exact date).  OBJECTIVE:  She appears well, afebrile. Urine dipstick: not done.  ASSESSMENT:  Vaginal Discharge  Vaginal Odor   PLAN:  GC, chlamydia, trichomonas, BVAG, CVAG probe sent to lab. Treatment: To be determined once lab results are received ROV prn if symptoms persist or worsen.

## 2022-08-24 LAB — CERVICOVAGINAL ANCILLARY ONLY
Bacterial Vaginitis (gardnerella): POSITIVE — AB
Candida Glabrata: NEGATIVE
Candida Vaginitis: NEGATIVE
Chlamydia: NEGATIVE
Comment: NEGATIVE
Comment: NEGATIVE
Comment: NEGATIVE
Comment: NEGATIVE
Comment: NEGATIVE
Comment: NORMAL
Neisseria Gonorrhea: NEGATIVE
Trichomonas: NEGATIVE

## 2022-08-24 MED ORDER — METRONIDAZOLE 500 MG PO TABS: 500.0000 mg | ORAL_TABLET | Freq: Two times a day (BID) | ORAL | 0 refills | Status: DC

## 2022-08-24 NOTE — Addendum Note (Signed)
Addended by: Mora Bellman on: 08/24/2022 02:05 PM   Modules accepted: Orders

## 2022-09-13 ENCOUNTER — Other Ambulatory Visit: Payer: Self-pay | Admitting: Obstetrics and Gynecology

## 2022-09-29 ENCOUNTER — Other Ambulatory Visit (HOSPITAL_COMMUNITY)
Admission: RE | Admit: 2022-09-29 | Discharge: 2022-09-29 | Disposition: A | Discharge status: Home / Self Care | Payer: Medicaid Other | Source: Ambulatory Visit | Attending: Obstetrics and Gynecology | Admitting: Obstetrics and Gynecology

## 2022-09-29 ENCOUNTER — Encounter: Payer: Self-pay | Admitting: Obstetrics and Gynecology

## 2022-09-29 ENCOUNTER — Ambulatory Visit (INDEPENDENT_AMBULATORY_CARE_PROVIDER_SITE_OTHER): Payer: Medicaid Other | Admitting: Obstetrics and Gynecology

## 2022-09-29 ENCOUNTER — Ambulatory Visit: Payer: Medicaid Other | Admitting: Obstetrics and Gynecology

## 2022-09-29 VITALS — BP 133/81 | HR 66 | Ht 63.0 in | Wt 179.4 lb

## 2022-09-29 DIAGNOSIS — Z01419 Encounter for gynecological examination (general) (routine) without abnormal findings: Secondary | ICD-10-CM | POA: Diagnosis not present

## 2022-09-29 DIAGNOSIS — R102 Pelvic and perineal pain: Secondary | ICD-10-CM | POA: Diagnosis not present

## 2022-09-29 NOTE — Patient Instructions (Signed)
Mittelschmerz Mittelschmerz is pain in the lower abdomen that occurs between menstrual periods. The pain in this condition: Affects one side of the lower abdomen. The side that it affects may change from month to month. May be mild or severe. May last from minutes to hours. May be accompanied by light vaginal bleeding. Mittelschmerz is caused by the growth and release of an egg from an ovary, and it is a natural part of the ovulation cycle. It often happens about 2 weeks after a period ends. Follow these instructions at home: Pay attention to any changes in your condition. Let your health care provider know about them. Managing pain Take these actions to help with the pain: Try soaking in a hot bath. Try using a heating pad to relax the muscles in the abdomen. If directed, apply heat to the area as often as told by your health care provider. Use the heat source that your health care provider recommends, such as a moist heat pack or a heating pad. Place a towel between your skin and the heat source. Leave the heat on for 20-30 minutes. Remove the heat if your skin turns bright red. This is especially important if you are unable to feel pain, heat, or cold. You have a greater risk of getting burned.  General instructions Take over-the-counter and prescription medicines only as told by your health care provider. Keep all follow-up visits. This is important. Contact a health care provider if: You have very bad pain most months. You have abdominal pain that lasts longer than 24 hours, especially if the pain is in the lower right side of the abdomen. Your pain medicine is not helping. You have a fever. You have nausea or vomiting that will not go away. You miss your period. You have vaginal bleeding between your periods that is heavier than spotting. Summary Mittelschmerz is pain in the lower abdomen that occurs between menstrual periods. Mittelschmerz is caused by the growth and release of  an egg from an ovary, and it is a natural part of the ovulation cycle. To relieve your pain, try soaking in a hot bath or using a heating pad. Contact a health care provider if you have pain most months of the year, your pain lasts more than 24 hours, or you have vaginal bleeding between your periods that is heavier than spotting. This information is not intended to replace advice given to you by your health care provider. Make sure you discuss any questions you have with your health care provider. Document Revised: 11/28/2020 Document Reviewed: 11/28/2020 Elsevier Patient Education  [place].

## 2022-09-29 NOTE — Progress Notes (Addendum)
   WELL-WOMAN PHYSICAL & PAP Patient name: Mackenzie Berger MRN [id number]  Date of birth: Dec 17, 2000 Chief Complaint:   No chief complaint on file.  History of Present Illness:   Mackenzie Berger is a 22 y.o. G0P0000 African American female being seen today for a routine well-woman exam.  Current complaints: LT side pelvic pain x 1 week. Not sexually active. Never had pelvic exam or pap before.  PCP: [facility] (in-home healthcare per patient)      does not desire labs Patient's last menstrual period was 09/03/2022. The current method of family planning is abstinence.  Last pap n/a. Last mammogram: n/a. Family h/o breast cancer: No Last colonoscopy: n/a. Family h/o colorectal cancer: No Review of Systems:   Pertinent items are noted in HPI Denies any headaches, blurred vision, fatigue, shortness of breath, chest pain, abdominal pain, abnormal vaginal discharge/itching/odor/irritation, problems with periods, bowel movements, urination, or intercourse unless otherwise stated above. Pertinent History Reviewed:  Reviewed past medical,surgical, social and family history.  Reviewed problem list, medications and allergies. Physical Assessment:   Vitals:   09/29/22 1016  BP: 133/81  Pulse: 66  Weight: 179 lb 6.4 oz (81.4 kg)  Height: 5' 3"$  (1.6 m)  Body mass index is 31.78 kg/m.        Physical Examination:   General appearance - well appearing, and in no distress  Mental status - alert, oriented to person, place, and time  Psych:  She has a normal mood and affect  Skin - warm and dry, normal color, no suspicious lesions noted  Chest - effort normal, all lung fields clear to auscultation bilaterally  Heart - normal rate and regular rhythm  Neck:  midline trachea, no thyromegaly or nodules  Breasts - breasts appear normal, no suspicious masses, no skin or nipple changes or  axillary nodes  Abdomen - soft, nontender, nondistended, no masses or organomegaly  Pelvic - VULVA: normal  appearing vulva with no masses, tenderness or lesions  VAGINA: normal appearing vagina with normal color and discharge, no lesions  CERVIX: normal appearing cervix without discharge or lesions, no CMT  Thin prep pap is done with reflex HR HPV cotesting  UTERUS: uterus is felt to be normal size, shape, consistency and nontender   ADNEXA: No adnexal masses or tenderness noted.  Rectal - deferred  Extremities:  No swelling or varicosities noted  No results found for this or any previous visit (from the past 24 hour(s)).  Assessment & Plan:  1) Well woman exam with routine gynecological exam - Cytology - PAP( [facility])  2) Pelvic pain in female - Advised with the timing of the start of the pain there is a high possibility of it being [place] [facility] provided on [facility] can take Ibuprofen as needed for the pain  Labs/procedures today: pap  Mammogram at age 34 yo or sooner if problems Colonoscopy at age 79 yo or sooner if problems  No orders of the defined types were placed in this encounter.   Meds: No orders of the defined types were placed in this encounter.   Follow-up: No follow-ups on file.  Laury Deep MSN, CNM 09/29/2022 10:29 AM

## 2022-09-29 NOTE — Progress Notes (Signed)
Pt presents for AEX. Pt c/o pelvic pain on the left side for 1 week. Pt c/o recurring ingrown hair in the vaginal area. First PAP smears, declines STD testing.

## 2022-10-01 LAB — CYTOLOGY - PAP: Diagnosis: NEGATIVE

## 2022-10-04 ENCOUNTER — Encounter: Payer: Self-pay | Admitting: Obstetrics and Gynecology

## 2022-10-20 ENCOUNTER — Telehealth: Payer: Self-pay | Admitting: Emergency Medicine

## 2022-10-20 NOTE — Telephone Encounter (Signed)
RC to patient to discuss vaginal irritation symptoms. Pt scheduled for nurse visit.

## 2022-10-21 ENCOUNTER — Ambulatory Visit: Payer: Medicaid Other

## 2022-10-26 ENCOUNTER — Other Ambulatory Visit (HOSPITAL_COMMUNITY)
Admission: RE | Admit: 2022-10-26 | Discharge: 2022-10-26 | Disposition: A | Discharge status: Home / Self Care | Payer: Medicaid Other | Source: Ambulatory Visit | Attending: Obstetrics | Admitting: Obstetrics

## 2022-10-26 ENCOUNTER — Ambulatory Visit (INDEPENDENT_AMBULATORY_CARE_PROVIDER_SITE_OTHER): Payer: Medicaid Other

## 2022-10-26 VITALS — BP 133/76 | HR 84 | Wt 179.8 lb

## 2022-10-26 DIAGNOSIS — N898 Other specified noninflammatory disorders of vagina: Secondary | ICD-10-CM | POA: Insufficient documentation

## 2022-10-26 NOTE — Progress Notes (Signed)
SUBJECTIVE:  22 y.o. female complains of white, foul, and irritation vaginal discharge for 1 week. Denies abnormal vaginal bleeding or significant pelvic pain or fever. No UTI symptoms. Denies history of known exposure to STD.  Patient's last menstrual period was 09/03/2022.  OBJECTIVE:  She appears well, afebrile. Urine dipstick: not done.  ASSESSMENT:  Vaginal Discharge  Vaginal Odor   PLAN:  GC, chlamydia, trichomonas, BVAG, CVAG probe sent to lab. Treatment: To be determined once lab results are received ROV prn if symptoms persist or worsen.

## 2022-10-27 LAB — CERVICOVAGINAL ANCILLARY ONLY
Bacterial Vaginitis (gardnerella): NEGATIVE
Candida Glabrata: NEGATIVE
Candida Vaginitis: POSITIVE — AB
Chlamydia: NEGATIVE
Comment: NEGATIVE
Comment: NEGATIVE
Comment: NEGATIVE
Comment: NEGATIVE
Comment: NEGATIVE
Comment: NORMAL
Neisseria Gonorrhea: NEGATIVE
Trichomonas: NEGATIVE

## 2022-11-02 ENCOUNTER — Other Ambulatory Visit: Payer: Self-pay

## 2022-11-02 DIAGNOSIS — B379 Candidiasis, unspecified: Secondary | ICD-10-CM

## 2022-11-02 MED ORDER — FLUCONAZOLE 150 MG PO TABS
150.0000 mg | ORAL_TABLET | Freq: Once | ORAL | 0 refills | Status: AC
Start: 2022-11-02 — End: 2022-11-02

## 2022-11-19 ENCOUNTER — Other Ambulatory Visit: Payer: Self-pay | Admitting: Nurse Practitioner

## 2022-11-19 ENCOUNTER — Encounter: Payer: Self-pay | Admitting: Nurse Practitioner

## 2022-11-19 DIAGNOSIS — R519 Headache, unspecified: Secondary | ICD-10-CM

## 2022-12-20 ENCOUNTER — Encounter: Payer: Self-pay | Admitting: Nurse Practitioner

## 2022-12-24 ENCOUNTER — Inpatient Hospital Stay: Admission: RE | Admit: 2022-12-24 | Payer: Medicaid Other | Source: Ambulatory Visit

## 2023-01-31 ENCOUNTER — Ambulatory Visit: Payer: Medicaid Other | Admitting: Student

## 2023-02-21 ENCOUNTER — Ambulatory Visit: Payer: Medicaid Other | Admitting: Obstetrics and Gynecology

## 2023-06-19 ENCOUNTER — Encounter (HOSPITAL_COMMUNITY): Payer: Self-pay

## 2023-06-19 ENCOUNTER — Ambulatory Visit (HOSPITAL_COMMUNITY)
Admission: EM | Admit: 2023-06-19 | Discharge: 2023-06-19 | Disposition: A | Discharge status: Home / Self Care | Payer: Medicaid Other | Attending: Family Medicine | Admitting: Family Medicine

## 2023-06-19 DIAGNOSIS — R002 Palpitations: Secondary | ICD-10-CM

## 2023-06-19 DIAGNOSIS — I10 Essential (primary) hypertension: Secondary | ICD-10-CM | POA: Diagnosis not present

## 2023-06-19 DIAGNOSIS — R0789 Other chest pain: Secondary | ICD-10-CM

## 2023-06-19 NOTE — ED Triage Notes (Signed)
Pt reports two days ago started with heart palpation and chest pain. Pt denies any recent trauma to her chest.  Pt has metoprolol for her hypertension but has missed several doses.

## 2023-06-19 NOTE — Discharge Instructions (Signed)
Restart taking your metoprolol 25 mg once a day.  Consider getting a weekly pill planner to help you know when you have an have not taken your medications

## 2023-06-19 NOTE — ED Provider Notes (Signed)
[facility]    CSN: [id number] Arrival date & time: 06/19/23  1415      History   Chief Complaint Chief Complaint  Patient presents with   Chest Pain    HPI Mackenzie Berger is a 22 y.o. female.    Chest Pain Here for some palpitations and chest pain.  Yesterday she was having some palpitations but those have resolved.  Today she noted a little chest discomfort, and decided to come in to get checked out.  No fever or cough or congestion  She has not taken her metoprolol 25 mg in a few days.  She states she just keeps forgetting it.    Past Medical History:  Diagnosis Date   Anemia    Asthma    Heart abnormality    per mom at times her heart will race-when she was born she had be on digoxin-lately it is bothering her more-we are from [place] and don't have a cardiologist yet.   MRSA (methicillin resistant Staphylococcus aureus)     Patient Active Problem List   Diagnosis Date Noted   Women's annual routine gynecological examination 03/11/2021   Pelvic pain in female 09/12/2019   Abnormal uterine bleeding (AUB) 01/09/2018   Iron deficiency anemia due to chronic blood loss 03/11/2017   Heavy menses 03/11/2017   Syncope 03/11/2017   Anemia 03/08/2017    History reviewed. No pertinent surgical history.  OB History     Gravida  0   Para  0   Term  0   Preterm  0   AB  0   Living  0      SAB  0   IAB  0   Ectopic  0   Multiple  0   Live Births  0            Home Medications    Prior to Admission medications   Medication Sig Start Date End Date Taking? Authorizing Provider  metoprolol tartrate (LOPRESSOR) 25 MG tablet Take 25 mg by mouth 2 (two) times daily.   Yes [provider]  albuterol (PROVENTIL HFA;VENTOLIN HFA) 108 (90 Base) MCG/ACT inhaler Inhale 2 puffs into the lungs every 6 (six) hours as needed for wheezing or shortness of breath. Patient not taking: Reported on 10/26/2022    [provider]  albuterol  (PROVENTIL) (2.5 MG/3ML) 0.083% nebulizer solution Take 2.5 mg by nebulization every 6 (six) hours as needed for wheezing or shortness of breath. Patient not taking: Reported on 10/26/2022    [provider]  Ascorbic Acid (VITAMIN C) 1000 MG tablet 1 tablet Orally Once a day for 30 day(s)    [provider]  benzonatate (TESSALON) 100 MG capsule Take 1 capsule (100 mg total) by mouth every 8 (eight) hours. Patient not taking: Reported on 08/23/2022 01/22/21   Raspet, Noberto Retort, PA-C  cetirizine (ZYRTEC ALLERGY) 10 MG tablet Take 1 tablet (10 mg total) by mouth daily as needed for allergies or rhinitis. Patient not taking: Reported on 10/26/2022 12/02/19   Petrucelli, Pleas Koch, PA-C  Elagolix Sodium (ORILISSA) 150 MG TABS Take 1 tablet by mouth daily. Patient not taking: Reported on 09/29/2022 07/18/20   Adam Phenix, MD  ferrous sulfate 325 (65 FE) MG tablet Take 1 tablet (325 mg total) by mouth 3 (three) times daily with meals. 03/11/17   Marca Ancona, MD  fluticasone-salmeterol (ADVAIR) 250-50 MCG/ACT AEPB Inhale 1 puff into the lungs in the morning and at bedtime.  Patient not taking: Reported on 10/26/2022 01/22/21   Raspet, Erin K, PA-C  IBU 600 MG tablet TAKE ONE (1) TABLET BY MOUTH EVERY 6 HOURS AS NEEDED Patient taking differently: Take 600 mg by mouth every 6 (six) hours as needed for fever, headache or mild pain (pain score 1-3). 07/01/17   Adam Phenix, MD  metroNIDAZOLE (FLAGYL) 500 MG tablet Take 1 tablet (500 mg total) by mouth 2 (two) times daily. Patient not taking: Reported on 09/29/2022 08/24/22   Constant, Peggy, MD  montelukast (SINGULAIR) 10 MG tablet Take 10 mg by mouth at bedtime. Patient not taking: Reported on 09/29/2022    [provider]  Olopatadine HCl 0.2 % SOLN Place 1 drop into both eyes daily as needed (for allergies).  Patient not taking: Reported on 09/29/2022 11/19/19   [provider]  VITAMIN D PO Take by mouth.    [provider]  Norethindrone Acetate-Ethinyl Estradiol (LOESTRIN 1.5/30, 21,) 1.5-30 MG-MCG tablet Take 1 tablet by mouth daily. Patient not taking: Reported on 09/12/2019 01/09/18 12/02/19  Orvilla Cornwall A, CNM  Norgestimate-Ethinyl Estradiol Triphasic (TRI-SPRINTEC) 0.18/0.215/0.25 MG-35 MCG tablet Take 3 tablets by mouth daily for 3 days. Then, take 2 tablets daily for 3 days. Then, take 1 tablet daily until you get to the fourth row of the medicine. Do not take any of the tablets on the last row of the medication. Patient not taking: Reported on 12/02/2019 07/20/19 12/02/19  Barkley Boards, PA-C    Family History Family History  Problem Relation Age of Onset   Asthma Mother    Diabetes Mother     Social History Social History   Tobacco Use   Smoking status: Never    Passive exposure: Yes   Smokeless tobacco: Never   Tobacco comments:    mother and brothers smoke in the home  Vaping Use   Vaping status: Never Used  Substance Use Topics   Alcohol use: No   Drug use: No     Allergies   Patient has no known allergies.   Review of Systems Review of Systems  Cardiovascular:  Positive for chest pain.     Physical Exam Triage Vital Signs ED Triage Vitals  Encounter Vitals Group     BP 06/19/23 1422 (!) 140/83     Systolic BP Percentile --      Diastolic BP Percentile --      Pulse Rate 06/19/23 1422 70     Resp 06/19/23 1422 16     Temp 06/19/23 1422 98.6 F (37 C)     Temp Source 06/19/23 1422 Oral     SpO2 06/19/23 1437 97 %     Weight --      Height --      Head Circumference --      Peak Flow --      Pain Score --      Pain Loc --      Pain Education --      Exclude from Growth Chart --    No data found.  Updated Vital Signs BP (!) 140/83 (BP Location: Left Arm)   Pulse 70   Temp 98.6 F (37 C) (Oral)   Resp 16   SpO2 97%   Visual Acuity Right Eye Distance:   Left Eye Distance:   Bilateral Distance:    Right Eye Near:   Left Eye Near:     Bilateral Near:     Physical Exam Vitals reviewed.  Constitutional:      General: She is not in acute distress.    Appearance: She is not ill-appearing, toxic-appearing or diaphoretic.  HENT:     Mouth/Throat:     Mouth: Mucous membranes are moist.     Pharynx: No oropharyngeal exudate or posterior oropharyngeal erythema.  Eyes:     Extraocular Movements: Extraocular movements intact.     Conjunctiva/sclera: Conjunctivae normal.     Pupils: Pupils are equal, round, and reactive to light.  Cardiovascular:     Rate and Rhythm: Normal rate and regular rhythm.     Heart sounds: No murmur heard. Pulmonary:     Effort: Pulmonary effort is normal.     Breath sounds: Normal breath sounds.  Musculoskeletal:     Cervical back: Neck supple.  Lymphadenopathy:     Cervical: No cervical adenopathy.  Skin:    Coloration: Skin is not pale.  Neurological:     General: No focal deficit present.     Mental Status: She is alert and oriented to person, place, and time.  Psychiatric:        Behavior: Behavior normal.      UC Treatments / Results  Labs (all labs ordered are listed, but only abnormal results are displayed) Labs Reviewed - No data to display  EKG   Radiology No results found.  Procedures Procedures (including critical care time)  Medications Ordered in UC Medications - No data to display  Initial Impression / Assessment and Plan / UC Course  I have reviewed the triage vital signs and the nursing notes.  Pertinent labs & imaging results that were available during my care of the patient were reviewed by me and considered in my medical decision making (see chart for details).     EKG shows normal sinus rhythm without any ST segment changes.  Vital signs are otherwise reassuring and her blood pressure is only mildly elevated.  I have discussed with her that I think her symptoms are most likely are due to her not taking her metoprolol as directed, as it can affect  heart rhythm and rate also. She will restart taking it once daily.  We discussed measures to help her be more compliant with her daily dosing.  Final Clinical Impressions(s) / UC Diagnoses   Final diagnoses:  None   Discharge Instructions   None    ED Prescriptions   None    PDMP not reviewed this encounter.   Zenia Resides, MD 06/19/23 [phone]

## 2023-06-22 ENCOUNTER — Encounter: Payer: Self-pay | Admitting: Obstetrics and Gynecology

## 2023-06-22 ENCOUNTER — Ambulatory Visit: Payer: Medicaid Other | Admitting: Obstetrics and Gynecology

## 2023-06-22 VITALS — BP 133/82 | HR 64 | Wt 178.0 lb

## 2023-06-22 DIAGNOSIS — N939 Abnormal uterine and vaginal bleeding, unspecified: Secondary | ICD-10-CM | POA: Diagnosis not present

## 2023-06-22 DIAGNOSIS — G8929 Other chronic pain: Secondary | ICD-10-CM

## 2023-06-22 DIAGNOSIS — R102 Pelvic and perineal pain: Secondary | ICD-10-CM

## 2023-06-22 MED ORDER — TRANEXAMIC ACID 650 MG PO TABS: 1300.0000 mg | ORAL_TABLET | Freq: Three times a day (TID) | ORAL | 2 refills | Status: DC

## 2023-06-22 NOTE — Progress Notes (Signed)
  CC: concern for endometriosis Subjective:    Patient ID: [name], female    DOB: 04-23-2001, 22 y.o.   MRN: [id number]  HPI 22 yo G0 seen for discussion of chronic pelvic pain and heavy menses.  She continues to complain of pain with menses and she finds ibuprofen to be ineffective.  However, the patient takes the medication once the pain is present, not at the initiation of menses.  Pt notes she has taken OCPs off and on in the past but has found them to be ineffective.  Pt has not taken OCPs since 2021.  Pt does not appear to have tried extended regimen OCPs.  Pt has never had a diagnostic laparoscopy.  Pt currently notes regular menses lasting 7 days.  During menses pt notes increased pain and nausea.  Pt advised chronic pain may be very complex with no definitive answer.   Review of Systems     Objective:   Physical Exam Vitals:   06/22/23 1620  BP: 133/82  Pulse: 64         Assessment & Plan:   1. Abnormal uterine bleeding (AUB) Rx for lysteda to decrease menstrual bleeding in non hormonal fashion  - tranexamic acid (LYSTEDA) 650 MG TABS tablet; Take 2 tablets (1,300 mg total) by mouth 3 (three) times daily. Take during menses for a maximum of five days  Dispense: 30 tablet; Refill: 2 - Ambulatory Referral For Surgery Scheduling  2. Chronic pelvic pain in female Will schedule diagnostic laparoscopy for evaluation of endometriosis and chronic pelvic pain.  Will try to schedule  with Dr. Briscoe Deutscher as she is a chronic pain specialist.  If high dose ibuprofen is ineffective, pt may call to get small rx of oxycodone/narcotic medication - Ambulatory Referral For Surgery Scheduling  I spent 30 minutes dedicated to the care of this patient including previsit review of records, face to face time with the patient discussing treatment options and post visit testing.   Warden Fillers, MD Faculty Attending, [facility]

## 2023-06-22 NOTE — Progress Notes (Signed)
Pt is in office for two cycles this month.  Pt has severe symptoms during cycles - pain, nausea. Pt takes Advil during cycles with little to no relief. Pt would like to know if she could possibly have endometriosis. LMP: 10/28 and again 11/19  Pt does not have any form of BC.

## 2023-06-27 ENCOUNTER — Telehealth: Payer: Self-pay

## 2023-06-27 NOTE — Telephone Encounter (Signed)
Called patient to advise that I was able to find available OR time on 07/20/23 w/ Dr. Donavan Foil. Left voicemail advising the patient is scheduled at [facility] at 12 pm and will need to arrive by 10 am. Asked patient to call me to confirm procedure. [phone].

## 2023-06-27 NOTE — Telephone Encounter (Signed)
Called patient to advise that I was able to find available OR time on 07/20/23 w/ Dr. Donavan Foil. Advised the patient is scheduled at [facility] at 12 pm and will need to arrive by 10 am. Asked patient to call me to confirm procedure. [phone].

## 2023-07-06 ENCOUNTER — Telehealth: Payer: Self-pay | Admitting: Obstetrics and Gynecology

## 2023-07-06 NOTE — Telephone Encounter (Signed)
I spoke with the patient regarding her pre-operative appointment for the surgery scheduled on 12/18. I informed her that the pre-op appointment would be scheduled for 12/17 with Dr. Donavan Foil. The patient respectfully declined the appointment, stating that she would prefer to show up on the surgery date. I explained that the pre-op appointment is crucial for her healthcare and required for the surgery, but she still respectfully declined the appointment.

## 2023-07-19 ENCOUNTER — Institutional Professional Consult (permissible substitution): Payer: Medicaid Other | Admitting: Obstetrics and Gynecology

## 2023-07-20 ENCOUNTER — Ambulatory Visit (HOSPITAL_COMMUNITY): Admit: 2023-07-20 | Payer: Medicaid Other | Admitting: Obstetrics and Gynecology

## 2023-07-20 SURGERY — LAPAROSCOPY OPERATIVE
Anesthesia: Choice

## 2023-12-02 ENCOUNTER — Telehealth: Payer: Self-pay

## 2023-12-02 ENCOUNTER — Other Ambulatory Visit: Payer: Self-pay

## 2023-12-02 DIAGNOSIS — N939 Abnormal uterine and vaginal bleeding, unspecified: Secondary | ICD-10-CM

## 2023-12-02 NOTE — Telephone Encounter (Signed)
Routed message

## 2023-12-07 ENCOUNTER — Telehealth: Payer: Self-pay

## 2023-12-07 DIAGNOSIS — N939 Abnormal uterine and vaginal bleeding, unspecified: Secondary | ICD-10-CM

## 2023-12-07 MED ORDER — TRANEXAMIC ACID 650 MG PO TABS: 1300.0000 mg | ORAL_TABLET | Freq: Three times a day (TID) | ORAL | 0 refills | Status: DC

## 2023-12-07 NOTE — Telephone Encounter (Signed)
 Returned call and advised that refill for Lysteda  was approved. Also informed pt that provider advised f/u with Dr. Elester Grim in office if pelvic pain/issues occur, pt voiced understanding.

## 2024-01-23 ENCOUNTER — Other Ambulatory Visit: Payer: Self-pay | Admitting: Obstetrics and Gynecology

## 2024-01-23 DIAGNOSIS — N939 Abnormal uterine and vaginal bleeding, unspecified: Secondary | ICD-10-CM

## 2024-01-30 ENCOUNTER — Other Ambulatory Visit: Payer: Self-pay

## 2024-01-30 ENCOUNTER — Ambulatory Visit: Attending: Physician Assistant

## 2024-01-30 ENCOUNTER — Ambulatory Visit

## 2024-01-30 DIAGNOSIS — M25561 Pain in right knee: Secondary | ICD-10-CM | POA: Diagnosis present

## 2024-01-30 DIAGNOSIS — G8929 Other chronic pain: Secondary | ICD-10-CM | POA: Insufficient documentation

## 2024-01-30 NOTE — Therapy (Signed)
 OUTPATIENT PHYSICAL THERAPY LOWER EXTREMITY EVALUATION   Patient Name: Mackenzie Berger MRN: [id number] DOB:2001/01/30, 23 y.o., female Today's Date: 01/30/2024  END OF SESSION:  PT End of Session - 01/30/24 1140     Visit Number 1    Date for PT Re-Evaluation 03/26/24    Progress Note Due on Visit 10    PT Start Time 1052    PT Stop Time 1134    PT Time Calculation (min) 42 min    Activity Tolerance Patient tolerated treatment well    Behavior During Therapy WFL for tasks assessed/performed          Past Medical History:  Diagnosis Date   Anemia    Asthma    Heart abnormality    per mom at times her heart will race-when she was born she had be on digoxin-lately it is bothering her more-we are from [place] and don't have a cardiologist yet.   MRSA (methicillin resistant Staphylococcus aureus)    History reviewed. No pertinent surgical history. Patient Active Problem List   Diagnosis Date Noted   Women's annual routine gynecological examination 03/11/2021   Pelvic pain in female 09/12/2019   Abnormal uterine bleeding (AUB) 01/09/2018   Iron deficiency anemia due to chronic blood loss 03/11/2017   Heavy menses 03/11/2017   Syncope 03/11/2017   Anemia 03/08/2017    PCP: no  REFERRING PROVIDER: Ottie Chew, PA, orthopedics  REFERRING DIAG: R knee pain/ patellar pain  THERAPY DIAG:  Chronic pain of right knee  Rationale for Evaluation and Treatment: Rehabilitation  ONSET DATE: Dec 01, 2023  SUBJECTIVE:   SUBJECTIVE STATEMENT: On and one half months ago developed R knee pain unsure of cause, saw orthopedist who referred her here, don't know why it hurts  PERTINENT HISTORY: Non known trauma PAIN:  Are you having pain? Took ibuprofen  initially now not so much   PRECAUTIONS: None  RED FLAGS: None   WEIGHT BEARING RESTRICTIONS: No  FALLS:  Has patient fallen in last 6 months? no  LIVING ENVIRONMENT: Lives with: lives with their family Lives in:  House/apartment Stairs: No Has following equipment at home: None  OCCUPATION: works in Engineering geologist  PLOF: Independent  PATIENT GOALS: get rid of pain  NEXT MD VISIT: November 2025  OBJECTIVE:  Note: Objective measures were completed at Evaluation unless otherwise noted.  DIAGNOSTIC FINDINGS: pt states had MRI unable to obtain today in records review  PATIENT SURVEYS:  Lower Extremity Functional Score: 49 / 80 = 61.3 %  COGNITION: Overall cognitive status: Within functional limits for tasks assessed     SENSATION: WFL  EDEMA:  None noted  MUSCLE LENGTH: Hamstrings: Right wfl deg; Left wfl deg Debby test: Right -10 deg; Left -10 deg  POSTURE: R iliac crest elevated, mild genu valgum noted  PALPATION: Patellar mobility R normal, non painful, no pain with palpation med and lat jt line,ant and lat hip musculature  LOWER EXTREMITY ROM:  Passive ROM Right eval Left eval  Hip flexion 115 130  Hip extension    Hip abduction    Hip adduction    Hip internal rotation    Hip external rotation    Knee flexion 138 138  Knee extension 3 hyperext 3 hyperext  Ankle dorsiflexion    Ankle plantarflexion    Ankle inversion    Ankle eversion     (Blank rows = wnl)  LOWER EXTREMITY MMT:all wnl with burst testing  MMT Right eval Left eval  Hip flexion  Hip extension    Hip abduction    Hip adduction    Hip internal rotation    Hip external rotation    Knee flexion    Knee extension    Ankle dorsiflexion    Ankle plantarflexion    Ankle inversion    Ankle eversion     (Blank rows = wnl)  LOWER EXTREMITY SPECIAL TESTS:  Knee special tests: Patellafemoral apprehension test: negative and Patellafemoral grind test: negative  FUNCTIONAL TESTS:  30 seconds chair stand test 14reps  GAIT: Distance walked: in clinic, no deficits or antalgia noted                                                                                                                                  TREATMENT DATE: 01/30/24 Evaluation, education regarding mechanics, anatomy of R knee, patello femoral jt and hip relationship, instructed in R hip stretch    PATIENT EDUCATION:  Education details: POC, goals Person educated: Patient Education method: Explanation, Demonstration, and Tactile cues Education comprehension: verbalized understanding, returned demonstration, verbal cues required, and tactile cues required  HOME EXERCISE PROGRAM: Access Code: [email or web address] URL: [email or web address] Date: 01/30/2024 Prepared by: Marabeth Melland  Exercises - Prone SIJ Anterior Rotation Mobilization on Table  - 1 x daily - 7 x weekly - 1 sets - 10 reps - 5 -10s hold  ASSESSMENT:  CLINICAL IMPRESSION: Patient is a 23 y.o. female who was seen today for physical therapy evaluation for R patellofemoral pain. Evaluation revealed normal strength B LE's, normal ROM B knees, decreased ROM for R hip flexion compared to L.  No edema or replication of pain with overpressure R patella.  Instructed her in R hip stretches.  She is going to return in 2 weeks for a re check, as well as return to work in Engineering geologist.  Recommend brief duration of physical therapy to address her pain and recovery of normal function  OBJECTIVE IMPAIRMENTS: decreased knowledge of condition, decreased ROM, increased fascial restrictions, postural dysfunction, and pain.   ACTIVITY LIMITATIONS: carrying, lifting, bending, squatting, sleeping, and dressing  PARTICIPATION LIMITATIONS: occupation  PERSONAL FACTORS: Behavior pattern, Past/current experiences, and 1 comorbidity: anemia history are also affecting patient's functional outcome.   REHAB POTENTIAL: Good  CLINICAL DECISION MAKING: Evolving/moderate complexity  EVALUATION COMPLEXITY: Moderate   GOALS: Goals reviewed with patient? Yes  SHORT TERM GOALS: Target date: 2 weeks,02/13/24 I HEP Baseline: Goal status: INITIAL   LONG TERM GOALS: Target date: 8 weeks,  03/26/24  30 sec sit to stand 18 or greater Baseline: 14 Goal status: INITIAL  2.  R hip ROM for flexion improve from 115 to 130 Baseline:  Goal status: INITIAL  3.  Able to achieve tailor position R LE which provokes pain at evaluation, without pain Baseline:  Goal status: INITIAL  4.  LEFS improve from 62% to 30% or less Baseline:  Goal status: INITIAL    PLAN:  PT FREQUENCY:  every other week  PT DURATION: 8 weeks  PLANNED INTERVENTIONS: 97110-Therapeutic exercises, 97530- Therapeutic activity, [id number]- Neuromuscular re-education, 97535- Self Care, and [id number]- Manual therapy  PLAN FOR NEXT SESSION: reassess ROM, pain provoking position, strength, advance therex, home routine   Asusena Sigley L Mahin Guardia, PT, DPT, OCS 01/30/2024, 11:50 AM

## 2024-02-22 ENCOUNTER — Ambulatory Visit

## 2024-02-23 ENCOUNTER — Encounter: Payer: Self-pay | Admitting: Podiatry

## 2024-02-23 ENCOUNTER — Ambulatory Visit (INDEPENDENT_AMBULATORY_CARE_PROVIDER_SITE_OTHER): Admitting: Podiatry

## 2024-02-23 ENCOUNTER — Ambulatory Visit (INDEPENDENT_AMBULATORY_CARE_PROVIDER_SITE_OTHER)

## 2024-02-23 DIAGNOSIS — M7751 Other enthesopathy of right foot: Secondary | ICD-10-CM

## 2024-02-24 NOTE — Progress Notes (Signed)
 Subjective:   Patient ID: Mackenzie Berger, female   DOB: 23 y.o.   MRN: [id number]   HPI Patient presents concerned about pain in her right ankle with history of an ankle injury and also has nail disease that she just wanted checked with no pain.  Patient does not currently smoke tries to be active and sprained her ankle approximate 4 years ago   Review of Systems  All other systems reviewed and are negative.       Objective:  Physical Exam Vitals and nursing note reviewed.  Constitutional:      Appearance: She is well-developed.  Pulmonary:     Effort: Pulmonary effort is normal.  Musculoskeletal:        General: Normal range of motion.  Skin:    General: Skin is warm.  Neurological:     Mental Status: She is alert.     Neurovascular status intact muscle strength found to be adequate range of motion adequate with patient noted to have discomfort in the right ankle above the mild nature with inversion eversion and slightly excessive but in comparison to same as the left ankle.  She seems to be somewhat loose in her gait but has not had ankle sprain since the original 1 and nails just have slight irritation.  Good digital perfusion     Assessment:  Probability for low-grade capsulitis of the ankle right secondary to injury     Plan:  H&P x-rays reviewed discussed and at this point I do not recommend any treatment as the ankle joint looks healthy and good.  Patient will work on strengthening exercises and wearing good support shoes and if develops further pain or pathology will be seen back  X-rays indicate the ankle mortise looks healthy ankle joint looks healthy no indication of previous fracture diastases injury

## 2024-03-07 ENCOUNTER — Ambulatory Visit: Admitting: Physical Therapy

## 2024-06-23 ENCOUNTER — Emergency Department (HOSPITAL_COMMUNITY)
Admission: EM | Admit: 2024-06-23 | Discharge: 2024-06-23 | Disposition: A | Discharge status: Home / Self Care | Attending: Emergency Medicine | Admitting: Emergency Medicine

## 2024-06-23 ENCOUNTER — Other Ambulatory Visit: Payer: Self-pay

## 2024-06-23 ENCOUNTER — Encounter (HOSPITAL_COMMUNITY): Payer: Self-pay

## 2024-06-23 DIAGNOSIS — R112 Nausea with vomiting, unspecified: Secondary | ICD-10-CM | POA: Insufficient documentation

## 2024-06-23 DIAGNOSIS — B3731 Acute candidiasis of vulva and vagina: Secondary | ICD-10-CM | POA: Insufficient documentation

## 2024-06-23 LAB — COMPREHENSIVE METABOLIC PANEL WITH GFR
ALT: 19 U/L (ref 0–44)
AST: 19 U/L (ref 15–41)
Albumin: 4.4 g/dL (ref 3.5–5.0)
Alkaline Phosphatase: 59 U/L (ref 38–126)
Anion gap: 9 (ref 5–15)
BUN: 9 mg/dL (ref 6–20)
CO2: 23 mmol/L (ref 22–32)
Calcium: 9.4 mg/dL (ref 8.9–10.3)
Chloride: 106 mmol/L (ref 98–111)
Creatinine, Ser: 0.74 mg/dL (ref 0.44–1.00)
GFR, Estimated: >60 mL/min (ref >60)
Glucose, Bld: 122 mg/dL — ABNORMAL HIGH (ref 70–99)
Potassium: 4 mmol/L (ref 3.5–5.1)
Sodium: 138 mmol/L (ref 135–145)
Total Bilirubin: 0.5 mg/dL (ref 0.0–1.2)
Total Protein: 8 g/dL (ref 6.5–8.1)

## 2024-06-23 LAB — HCG, SERUM, QUALITATIVE: Preg, Serum: NEGATIVE

## 2024-06-23 LAB — URINALYSIS, ROUTINE W REFLEX MICROSCOPIC
Bilirubin Urine: NEGATIVE
Glucose, UA: NEGATIVE mg/dL
Hgb urine dipstick: NEGATIVE
Ketones, ur: NEGATIVE mg/dL
Leukocytes,Ua: NEGATIVE
Nitrite: NEGATIVE
Protein, ur: 30 mg/dL — AB
Specific Gravity, Urine: 1.029 (ref 1.005–1.030)
pH: 6 (ref 5.0–8.0)

## 2024-06-23 LAB — CBC
HCT: 34.7 % — ABNORMAL LOW (ref 36.0–46.0)
Hemoglobin: 10.7 g/dL — ABNORMAL LOW (ref 12.0–15.0)
MCH: 24.7 pg — ABNORMAL LOW (ref 26.0–34.0)
MCHC: 30.8 g/dL (ref 30.0–36.0)
MCV: 80 fL (ref 80.0–100.0)
Platelets: 490 K/uL — ABNORMAL HIGH (ref 150–400)
RBC: 4.34 MIL/uL (ref 3.87–5.11)
RDW: 17.2 % — ABNORMAL HIGH (ref 11.5–15.5)
WBC: 6.7 K/uL (ref 4.0–10.5)
nRBC: 0 % (ref 0.0–0.2)

## 2024-06-23 LAB — LIPASE, BLOOD: Lipase: 27 U/L (ref 11–51)

## 2024-06-23 MED ORDER — FLUCONAZOLE 150 MG PO TABS
150.0000 mg | ORAL_TABLET | Freq: Once | ORAL | Status: DC
  Filled 2024-06-23: qty 1

## 2024-06-23 MED ORDER — ONDANSETRON 4 MG PO TBDP: 4.0000 mg | ORAL_TABLET | Freq: Three times a day (TID) | ORAL | 0 refills | Status: AC | PRN

## 2024-06-23 MED ORDER — ONDANSETRON HCL 4 MG/2ML IJ SOLN
4.0000 mg | Freq: Once | INTRAMUSCULAR | Status: AC
  Administered 2024-06-23: 4 mg via INTRAVENOUS
  Filled 2024-06-23: qty 2

## 2024-06-23 MED ORDER — LACTATED RINGERS IV BOLUS
1000.0000 mL | Freq: Once | INTRAVENOUS | Status: AC
  Administered 2024-06-23: 1000 mL via INTRAVENOUS

## 2024-06-23 MED ORDER — FAMOTIDINE 20 MG PO TABS: 20.0000 mg | ORAL_TABLET | Freq: Two times a day (BID) | ORAL | 0 refills | Status: AC

## 2024-06-23 NOTE — ED Triage Notes (Signed)
 PT reports she has been experiencing abdominal pain and nausea for the past 3 weeks. PT arrives AxOx[facility].

## 2024-06-23 NOTE — Discharge Instructions (Addendum)
 Nausea advice On discharge if you were prescribed zofran  please use this as needed for nausea. Stay well hydrated with small sips of fluids throughout the day. Follow a BRAT diet as described below for the next 24-48 hours. Return to ER for changing or worsening of symptoms.   FOODS TO FOCUS ON   Soft fruits: bananas, applesauce, avocado, pumpkin, canned fruit (packed in water not heavy syrup), and melons  Steamed or boiled vegetables: carrots, green beans, potatoes, and squash   Low-fiber starches: white bread, white rice, saltine crackers, cream of wheat, instant oatmeal, and noodles  Unseasoned skinless baked chicken or turkey, scrambled eggs, yogurt and kefir  Drinks: bone broth, apple juice, coconut water, Pedialyte, weak tea  Homemade oral rehydration solution to prevent dehydration: 1 Liter of clean or boiled water, mix in 1/2 teaspoon of salt and 6 teaspoons of sugar and stir until both salt and sugar are completely dissolved. Cool down the water to room temperature or cooler before drinking.   FOODS TO AVOID  Avoid milk and dairy products for three days. Yogurt and kefir are okay   Avoid fried, fatty, greasy and spicy foods  Avoid pork, veal, salmon, and sardines  Avoid raw vegetables such as parsnips, beets, sauerkraut, corn on the cob, cabbage, broccoli, cauliflower, and onions  Avoid citrus fruits: pineapples, oranges, grapefruits, lemons and limes  Other fruits to avoid:  tomatoes, cherries, grapes, figs, raisins, and seeded berries  Avoid extremely hot or cold beverages  Avoid alcohol  Avoid coffee and caffeinated sodas  Avoid added sugars and sweets like candy, soda and most juice  Information for Gastroenterology included. Call and make an appointment for ongoing evaluation of nausea/vomiting.

## 2024-06-23 NOTE — ED Provider Notes (Signed)
 [facility]GENC[facility] DEPARTMENT AT [facility] Provider Note   CSN: [id number] Arrival date & time: 06/23/24  1047     Patient presents with: Abdominal Pain and Nausea   Mackenzie Berger is a 23 y.o. female.   Patient is here for evaluation of nausea and vomiting x 3 weeks.  She is here today as the nausea and vomiting have worsened over the last week.  She reports persistent nausea throughout the day that is worse with movement.  She has about 2 episodes of vomiting a day.  Vomiting does not seem to be related to food.  She denies change in appetite and has increased her fluid intake.  She reports an episode of near syncope a couple days ago, but denies any loss of consciousness.  She reports dizziness and nausea at this time.  She denies EtOH or illicit drug use.  She denies abdominal surgery.  She reports previous nausea and vomiting not lasting more than a day or 2.  She does not believe that she is pregnant as she has not been sexually active.  Her last menstrual cycle was the end of October/beginning of November.  She denies abnormal vaginal discharge or bleeding.  She has a history of anemia and takes iron supplements.  She denies frank blood in stool though, admits that she does not often check the toilet after defecation.  She denies dysuria, hematuria, foul-smelling urine.  She has prescription Zofran  at home and has been taking this for nausea.  This medication has helped though it does make her sleepy and she often naps after taking it. Denies headaches or change in vision.  The history is provided by the patient.  Abdominal Pain      Prior to Admission medications   Medication Sig Start Date End Date Taking? Authorizing Provider  famotidine  (PEPCID ) 20 MG tablet Take 1 tablet (20 mg total) by mouth 2 (two) times daily. 06/23/24  Yes Rosina Almarie LABOR, PA-C  ondansetron  (ZOFRAN -ODT) 4 MG disintegrating tablet Take 1 tablet (4 mg total) by mouth every 8 (eight) hours  as needed for nausea or vomiting. 06/23/24  Yes Rosina Almarie A, PA-C  albuterol  (PROVENTIL  HFA;VENTOLIN  HFA) 108 (90 Base) MCG/ACT inhaler Inhale 2 puffs into the lungs every 6 (six) hours as needed for wheezing or shortness of breath. Patient not taking: Reported on 02/23/2024    [provider]  albuterol  (PROVENTIL ) (2.5 MG/3ML) 0.083% nebulizer solution Take 2.5 mg by nebulization every 6 (six) hours as needed for wheezing or shortness of breath. Patient not taking: Reported on 02/23/2024    [provider]  Ascorbic Acid (VITAMIN C) 1000 MG tablet 1 tablet Orally Once a day for 30 day(s)    [provider]  benzonatate  (TESSALON ) 100 MG capsule Take 1 capsule (100 mg total) by mouth every 8 (eight) hours. Patient not taking: Reported on 02/23/2024 01/22/21   Raspet, Erin K, PA-C  cetirizine  (ZYRTEC  ALLERGY) 10 MG tablet Take 1 tablet (10 mg total) by mouth daily as needed for allergies or rhinitis. 12/02/19   Petrucelli, Samantha R, PA-C  Elagolix Sodium  (ORILISSA ) 150 MG TABS Take 1 tablet by mouth daily. 07/18/20   Eveline Lynwood MATSU, MD  ferrous sulfate  325 (65 FE) MG tablet Take 1 tablet (325 mg total) by mouth 3 (three) times daily with meals. 03/11/17   Iskander, Caroline N, MD  fluticasone -salmeterol (ADVAIR) 250-50 MCG/ACT AEPB Inhale 1 puff into the lungs in the morning and at bedtime. 01/22/21  Raspet, Erin K, PA-C  IBU 600 MG tablet TAKE ONE (1) TABLET BY MOUTH EVERY 6 HOURS AS NEEDED Patient taking differently: Take 600 mg by mouth every 6 (six) hours as needed for fever, headache or mild pain (pain score 1-3). 07/01/17   Eveline Lynwood MATSU, MD  metoprolol tartrate (LOPRESSOR) 25 MG tablet Take 25 mg by mouth 2 (two) times daily.    [provider]  montelukast (SINGULAIR) 10 MG tablet Take 10 mg by mouth at bedtime.    [provider]  Olopatadine HCl 0.2 % SOLN Place 1 drop into both eyes daily as needed (for allergies).  11/19/19   [provider]  spironolactone (ALDACTONE) 50 MG tablet Take 50 mg by mouth daily.    [provider]  tranexamic acid  (LYSTEDA ) 650 MG TABS tablet TAKE TWO TABLETS BY MOUTH THREE TIMES A DAY . TAKE DURING MENSE FOR A MAXIMUM OF FIVE DAYS 01/30/24   Zina Jerilynn LABOR, MD  VITAMIN D PO Take by mouth.    [provider]  Norethindrone  Acetate-Ethinyl Estradiol  (LOESTRIN  1.5/30, 21,) 1.5-30 MG-MCG tablet Take 1 tablet by mouth daily. Patient not taking: Reported on 09/12/2019 01/09/18 12/02/19  Marguerite Caddy A, CNM  Norgestimate-Ethinyl Estradiol  Triphasic (TRI-SPRINTEC) 0.18/0.215/0.25 MG-35 MCG tablet Take 3 tablets by mouth daily for 3 days. Then, take 2 tablets daily for 3 days. Then, take 1 tablet daily until you get to the fourth row of the medicine. Do not take any of the tablets on the last row of the medication. Patient not taking: Reported on 12/02/2019 07/20/19 12/02/19  McDonald, Logan A, PA-C    Allergies: Patient has no known allergies.    Review of Systems  Gastrointestinal:  Positive for abdominal pain.    Vitals:   06/23/24 1059 06/23/24 1130 06/23/24 1400  BP: 125/85 132/73 127/74  Pulse: 78 74 65  Resp: 16 18 18   Temp: 98.8 F (37.1 C) 98.2 F (36.8 C)   TempSrc: Oral Oral   SpO2: 100% 100% 100%     Updated Vital Signs BP 127/74   Pulse 65   Temp 98.2 F (36.8 C) (Oral)   Resp 18   SpO2 100%   Physical Exam Vitals and nursing note reviewed.  Constitutional:      General: She is not in acute distress.    Appearance: She is well-developed and normal weight.  HENT:     Head: Normocephalic and atraumatic.     Mouth/Throat:     Mouth: Mucous membranes are moist.     Pharynx: Oropharynx is clear. No oropharyngeal exudate.  Eyes:     General: No scleral icterus.    Extraocular Movements: Extraocular movements intact.  Cardiovascular:     Rate and Rhythm: Normal rate and regular rhythm.     Heart sounds: Normal heart sounds.  Pulmonary:      Effort: Pulmonary effort is normal. No respiratory distress.     Breath sounds: Normal breath sounds. No stridor. No wheezing, rhonchi or rales.  Abdominal:     General: Abdomen is flat. Bowel sounds are normal. There is no distension. There are no signs of injury.     Palpations: Abdomen is soft.     Tenderness: There is abdominal tenderness in the suprapubic area. There is no right CVA tenderness or left CVA tenderness. Negative signs include Murphy's sign, Rovsing's sign and McBurney's sign.     Hernia: No hernia is present.  Skin:    General: Skin is warm  and dry.     Capillary Refill: Capillary refill takes less than 2 seconds.     Coloration: Skin is not cyanotic, jaundiced or pale.  Neurological:     Mental Status: She is alert and oriented to person, place, and time.     (all labs ordered are listed, but only abnormal results are displayed) Labs Reviewed  COMPREHENSIVE METABOLIC PANEL WITH GFR - Abnormal; Notable for the following components:      Result Value   Glucose, Bld 122 (*)    All other components within normal limits  CBC - Abnormal; Notable for the following components:   Hemoglobin 10.7 (*)    HCT 34.7 (*)    MCH 24.7 (*)    RDW 17.2 (*)    Platelets 490 (*)    All other components within normal limits  URINALYSIS, ROUTINE W REFLEX MICROSCOPIC - Abnormal; Notable for the following components:   APPearance HAZY (*)    Protein, ur 30 (*)    Bacteria, UA RARE (*)    All other components within normal limits  LIPASE, BLOOD  HCG, SERUM, QUALITATIVE    EKG: EKG Interpretation Date/Time:  Saturday June 23 2024 13:10:22 EST Ventricular Rate:  68 PR Interval:  164 QRS Duration:  81 QT Interval:  389 QTC Calculation: 414 R Axis:   79  Text Interpretation: Sinus rhythm Nonspecific T wave abnormality Confirmed by Bernard Drivers ([id number]) on 06/23/2024 1:58:54 PM  Radiology: No results found.  Procedures   Medications Ordered in the ED  lactated ringers   bolus 1,000 mL (0 mLs Intravenous Stopped 06/23/24 1507)  ondansetron  (ZOFRAN ) injection 4 mg (4 mg Intravenous Given 06/23/24 1311)        Patient presents to the ED for concern of nausea and vomiting x 3 weeks, this involves an extensive number of treatment options, and is a complaint that carries with it a high risk of complications and morbidity.  The differential diagnosis includes gastroenteritis, intracranial pressure, DKA, ETOH, medication induced, marijuana hyperemesis, bowel obstruction, gastroparesis, GERD, peptic ulcer disease, pancreatitis, electrolyte imbalance, biliary obstruction, or pregnancy.  Low suspicion for intracranial pressure as patient is normotensive denies headaches, loss of consciousness, or change in vision. Patient denies alcohol or illicit drug use.  Pregnancy test negative.  Patient does not have a history of diabetes and CBG is not grossly elevated. Pancreatitis is likely suspected as lipase is normal.  LFTs are unremarkable, lowering suspicion for biliary obstruction.  No electrolyte abnormalities.  Co morbidities that complicate the patient evaluation  Iron deficiency anemia   Additional history obtained:  Additional history obtained from  Outside Medical Records   External records from outside source obtained and reviewed including previous lab work to compare   Lab Tests:  I Ordered, and personally interpreted labs.  The pertinent results include:   Hgb of 10.7; last hemoglobin in our system is from April 2023 which appeared to be her within normal provide at that time compared to other 2023 labs. No electrolyte abnormalities Urinalysis could represent a yeast infection.  Cardiac Monitoring:  The patient was maintained on a cardiac monitor.  I personally viewed and interpreted the cardiac monitored which showed an underlying rhythm of: Normal sinus rhythm with no signs of STEMI.   Medicines ordered and prescription drug management:  I  ordered medication including IV Zofran  and lactated Ringer 's bolus for nausea and dizziness. Fluconazole  for possible vulvovaginal candidiasis. Reevaluation of the patient after these medicines showed that the patient improved I have  reviewed the patients home medicines and have made adjustments as needed   Problem List / ED Course:  Patient is here for evaluation of nausea and vomiting for the past several weeks.  Labs and EKG are unremarkable.  Urinalysis could represent vulvovaginal candidiasis; fluconazole  was ordered however it was not given prior to discharge.  Patient reported improvement of symptoms with IV Zofran  and LR bolus. She was encouraged to follow-up with her primary care physician and given information to schedule an appointment with gastroenterology for further evaluation if symptoms continue.   Reevaluation:  After the interventions noted above, I reevaluated the patient and found that they have :improved   Dispostion:  After consideration of the diagnostic results and the patients response to treatment, I feel that the patent would benefit from supportive care in the home setting.  Prescribed Pepcid  and Zofran  for symptom management.  Given information for [facility], she will make an appointment with them for ongoing evaluation of nausea and vomiting.  She will continue to follow-up regularly with her primary care.  She will return if symptoms worsen or any other concerning symptoms develop.   Medical Decision Making Amount and/or Complexity of Data Reviewed Labs: ordered.  Risk Prescription drug management.    Final diagnoses:  Vulvovaginal candidiasis  Nausea and vomiting, unspecified vomiting type    ED Discharge Orders          Ordered    famotidine  (PEPCID ) 20 MG tablet  2 times daily        06/23/24 1440    ondansetron  (ZOFRAN -ODT) 4 MG disintegrating tablet  Every 8 hours PRN        06/23/24 1440               Rosina Almarie DELENA DEVONNA 06/23/24 2128    Randol Simmonds, MD 06/24/24 1300

## 2024-07-10 ENCOUNTER — Other Ambulatory Visit: Payer: Self-pay

## 2024-07-10 ENCOUNTER — Emergency Department (HOSPITAL_COMMUNITY)
Admission: EM | Admit: 2024-07-10 | Discharge: 2024-07-10 | Disposition: A | Discharge status: Home / Self Care | Attending: Emergency Medicine | Admitting: Emergency Medicine

## 2024-07-10 ENCOUNTER — Emergency Department (HOSPITAL_COMMUNITY)

## 2024-07-10 DIAGNOSIS — R112 Nausea with vomiting, unspecified: Secondary | ICD-10-CM

## 2024-07-10 LAB — LIPASE, BLOOD: Lipase: 33 U/L (ref 11–51)

## 2024-07-10 LAB — CBC WITH DIFFERENTIAL/PLATELET
Abs Immature Granulocytes: 0.03 K/uL (ref 0.00–0.07)
Basophils Absolute: 0.1 K/uL (ref 0.0–0.1)
Basophils Relative: 1 %
Eosinophils Absolute: 0.1 K/uL (ref 0.0–0.5)
Eosinophils Relative: 1 %
HCT: 35 % — ABNORMAL LOW (ref 36.0–46.0)
Hemoglobin: 10.6 g/dL — ABNORMAL LOW (ref 12.0–15.0)
Immature Granulocytes: 0 %
Lymphocytes Relative: 15 %
Lymphs Abs: 1.6 K/uL (ref 0.7–4.0)
MCH: 24.7 pg — ABNORMAL LOW (ref 26.0–34.0)
MCHC: 30.3 g/dL (ref 30.0–36.0)
MCV: 81.6 fL (ref 80.0–100.0)
Monocytes Absolute: 0.7 K/uL (ref 0.1–1.0)
Monocytes Relative: 6 %
Neutro Abs: 8.2 K/uL — ABNORMAL HIGH (ref 1.7–7.7)
Neutrophils Relative %: 77 %
Platelets: 599 K/uL — ABNORMAL HIGH (ref 150–400)
RBC: 4.29 MIL/uL (ref 3.87–5.11)
RDW: 16.4 % — ABNORMAL HIGH (ref 11.5–15.5)
WBC: 10.7 K/uL — ABNORMAL HIGH (ref 4.0–10.5)
nRBC: 0 % (ref 0.0–0.2)

## 2024-07-10 LAB — COMPREHENSIVE METABOLIC PANEL WITH GFR
ALT: 26 U/L (ref 0–44)
AST: 21 U/L (ref 15–41)
Albumin: 4.8 g/dL (ref 3.5–5.0)
Alkaline Phosphatase: 67 U/L (ref 38–126)
Anion gap: 12 (ref 5–15)
BUN: 10 mg/dL (ref 6–20)
CO2: 26 mmol/L (ref 22–32)
Calcium: 10 mg/dL (ref 8.9–10.3)
Chloride: 101 mmol/L (ref 98–111)
Creatinine, Ser: 0.7 mg/dL (ref 0.44–1.00)
GFR, Estimated: >60 mL/min (ref >60)
Glucose, Bld: 87 mg/dL (ref 70–99)
Potassium: 3.9 mmol/L (ref 3.5–5.1)
Sodium: 139 mmol/L (ref 135–145)
Total Bilirubin: 0.6 mg/dL (ref 0.0–1.2)
Total Protein: 8.6 g/dL — ABNORMAL HIGH (ref 6.5–8.1)

## 2024-07-10 LAB — TROPONIN T, HIGH SENSITIVITY: Troponin T High Sensitivity: <15 ng/L (ref 0–19)

## 2024-07-10 LAB — RESP PANEL BY RT-PCR (RSV, FLU A&B, COVID)  RVPGX2
Influenza A by PCR: NEGATIVE
Influenza B by PCR: NEGATIVE
Resp Syncytial Virus by PCR: NEGATIVE
SARS Coronavirus 2 by RT PCR: NEGATIVE

## 2024-07-10 LAB — HCG, QUANTITATIVE, PREGNANCY: hCG, Beta Chain, Quant, S: <1 m[IU]/mL (ref <5)

## 2024-07-10 MED ORDER — PROMETHAZINE HCL 25 MG PO TABS
12.5000 mg | ORAL_TABLET | Freq: Once | ORAL | Status: AC
  Administered 2024-07-10: 12.5 mg via ORAL
  Filled 2024-07-10: qty 1

## 2024-07-10 MED ORDER — DICYCLOMINE HCL 20 MG PO TABS: 20.0000 mg | ORAL_TABLET | Freq: Two times a day (BID) | ORAL | 0 refills | Status: DC

## 2024-07-10 MED ORDER — ONDANSETRON 4 MG PO TBDP
4.0000 mg | ORAL_TABLET | Freq: Once | ORAL | Status: AC
  Administered 2024-07-10: 4 mg via ORAL
  Filled 2024-07-10: qty 1

## 2024-07-10 MED ORDER — PROMETHAZINE HCL 25 MG PO TABS: 25.0000 mg | ORAL_TABLET | Freq: Four times a day (QID) | ORAL | 0 refills | Status: AC | PRN

## 2024-07-10 NOTE — ED Triage Notes (Signed)
 Pt ambulatory to triage with complaints of chest tightness and feeling short of breath that began on Sunday. Pt endorses a hx of asthma, and states that she has been using her inhaler with some improvement. Denies cough, denies sore throat.   Pt appears to be in no distress during triage.

## 2024-07-10 NOTE — ED Provider Notes (Signed)
 [place] EMERGENCY DEPARTMENT AT [facility] Provider Note   CSN: [id number] Arrival date & time: 07/10/24  1559     Patient presents with: Shortness of Breath and Chest Pain   Mackenzie Berger is a 23 y.o. female.  23 year old female presents ED with complaints of nausea and vomiting and associated congestion, shortness of breath chest tightness since Sunday.  Patient reports due to history of asthma she has to use her inhaler twice a day and advises that inhaler has improved shortness of breath mildly.  Patient reports her shortness of breath occurs intermittently and there is no obvious exacerbating factors.  Patient denies shortness of breath ED.  Patient has significant history of anemia, hypertension, asthma treated with ferrous sulfate  and metoprolol.  Patient reports chest pain does not radiate and she describes it as a 8/10 since Sunday.  Patient has been able to eat and drink but reports every once while she will have episode of nausea and vomiting.  Patient reports she is mainly concerned with nausea and vomiting.  Patient reports she is not on birth control, has not had noticeable exertional shortness of breath, and has not had any noticeable lower extremity edema.  Patient has never had a DVT or pulm embolism in the past.     Prior to Admission medications   Medication Sig Start Date End Date Taking? Authorizing Provider  dicyclomine  (BENTYL ) 20 MG tablet Take 1 tablet (20 mg total) by mouth 2 (two) times daily. 07/10/24  Yes Myriam Fonda RAMAN, PA-C  promethazine  (PHENERGAN ) 25 MG tablet Take 1 tablet (25 mg total) by mouth every 6 (six) hours as needed for nausea or vomiting. 07/10/24  Yes Myriam Fonda RAMAN, PA-C  albuterol  (PROVENTIL  HFA;VENTOLIN  HFA) 108 (90 Base) MCG/ACT inhaler Inhale 2 puffs into the lungs every 6 (six) hours as needed for wheezing or shortness of breath. Patient not taking: Reported on 02/23/2024    [provider]  albuterol  (PROVENTIL ) (2.5  MG/3ML) 0.083% nebulizer solution Take 2.5 mg by nebulization every 6 (six) hours as needed for wheezing or shortness of breath. Patient not taking: Reported on 02/23/2024    [provider]  Ascorbic Acid (VITAMIN C) 1000 MG tablet 1 tablet Orally Once a day for 30 day(s)    [provider]  benzonatate  (TESSALON ) 100 MG capsule Take 1 capsule (100 mg total) by mouth every 8 (eight) hours. Patient not taking: Reported on 02/23/2024 01/22/21   Raspet, Erin K, PA-C  cetirizine  (ZYRTEC  ALLERGY) 10 MG tablet Take 1 tablet (10 mg total) by mouth daily as needed for allergies or rhinitis. 12/02/19   Petrucelli, Samantha R, PA-C  Elagolix Sodium  (ORILISSA ) 150 MG TABS Take 1 tablet by mouth daily. 07/18/20   Eveline Lynwood MATSU, MD  famotidine  (PEPCID ) 20 MG tablet Take 1 tablet (20 mg total) by mouth 2 (two) times daily. 06/23/24   Rosina Almarie LABOR, PA-C  ferrous sulfate  325 (65 FE) MG tablet Take 1 tablet (325 mg total) by mouth 3 (three) times daily with meals. 03/11/17   Iskander, Caroline N, MD  fluticasone -salmeterol (ADVAIR) 250-50 MCG/ACT AEPB Inhale 1 puff into the lungs in the morning and at bedtime. 01/22/21   Raspet, Erin K, PA-C  IBU 600 MG tablet TAKE ONE (1) TABLET BY MOUTH EVERY 6 HOURS AS NEEDED Patient taking differently: Take 600 mg by mouth every 6 (six) hours as needed for fever, headache or mild pain (pain score 1-3). 07/01/17   Eveline Lynwood MATSU, MD  metoprolol tartrate (LOPRESSOR) 25 MG tablet Take 25 mg by mouth 2 (two) times daily.    [provider]  montelukast (SINGULAIR) 10 MG tablet Take 10 mg by mouth at bedtime.    [provider]  Olopatadine HCl 0.2 % SOLN Place 1 drop into both eyes daily as needed (for allergies).  11/19/19   [provider]  ondansetron  (ZOFRAN -ODT) 4 MG disintegrating tablet Take 1 tablet (4 mg total) by mouth every 8 (eight) hours as needed for nausea or vomiting. 06/23/24   Rosina Almarie LABOR, PA-C  spironolactone  (ALDACTONE) 50 MG tablet Take 50 mg by mouth daily.    [provider]  tranexamic acid  (LYSTEDA ) 650 MG TABS tablet TAKE TWO TABLETS BY MOUTH THREE TIMES A DAY . TAKE DURING MENSE FOR A MAXIMUM OF FIVE DAYS 01/30/24   Zina Jerilynn LABOR, MD  VITAMIN D PO Take by mouth.    [provider]  Norethindrone  Acetate-Ethinyl Estradiol  (LOESTRIN  1.5/30, 21,) 1.5-30 MG-MCG tablet Take 1 tablet by mouth daily. Patient not taking: Reported on 09/12/2019 01/09/18 12/02/19  Marguerite Willma LABOR, CNM  Norgestimate-Ethinyl Estradiol  Triphasic (TRI-SPRINTEC) 0.18/0.215/0.25 MG-35 MCG tablet Take 3 tablets by mouth daily for 3 days. Then, take 2 tablets daily for 3 days. Then, take 1 tablet daily until you get to the fourth row of the medicine. Do not take any of the tablets on the last row of the medication. Patient not taking: Reported on 12/02/2019 07/20/19 12/02/19  McDonald, Logan A, PA-C    Allergies: Patient has no known allergies.    Review of Systems  Respiratory:  Positive for shortness of breath.   Cardiovascular:  Positive for chest pain.  Gastrointestinal:  Positive for nausea and vomiting.  All other systems reviewed and are negative.   Updated Vital Signs BP 117/69 (BP Location: Right Arm)   Pulse 78   Temp 98.3 F (36.8 C)   Resp 16   LMP 07/01/2024 (Approximate)   SpO2 100%   Physical Exam Vitals and nursing note reviewed.  Constitutional:      Appearance: Normal appearance. She is well-developed.  HENT:     Head: Normocephalic and atraumatic.     Nose: Nose normal.  Eyes:     Extraocular Movements: Extraocular movements intact.     Conjunctiva/sclera: Conjunctivae normal.     Pupils: Pupils are equal, round, and reactive to light.  Cardiovascular:     Rate and Rhythm: Normal rate and regular rhythm.  Pulmonary:     Effort: Pulmonary effort is normal. No respiratory distress.     Breath sounds: Normal breath sounds.     Comments: Lungs are clear to auscultation in all  fields and patient denies any shortness of breath in the ED. Abdominal:     General: Bowel sounds are normal.     Palpations: Abdomen is soft.     Tenderness: There is no abdominal tenderness. There is no guarding.     Comments: Patient has no pain palpation throughout abdomen is soft with no distention.  Negative Murphy's and negative McBurney's.  Musculoskeletal:        General: Normal range of motion.     Cervical back: Normal range of motion.  Skin:    General: Skin is warm.     Capillary Refill: Capillary refill takes less than 2 seconds.     Coloration: Skin is not cyanotic.  Neurological:     General: No focal deficit present.     Mental Status: She  is alert.  Psychiatric:        Mood and Affect: Mood normal.        Behavior: Behavior normal.     (all labs ordered are listed, but only abnormal results are displayed) Labs Reviewed  COMPREHENSIVE METABOLIC PANEL WITH GFR - Abnormal; Notable for the following components:      Result Value   Total Protein 8.6 (*)    All other components within normal limits  CBC WITH DIFFERENTIAL/PLATELET - Abnormal; Notable for the following components:   WBC 10.7 (*)    Hemoglobin 10.6 (*)    HCT 35.0 (*)    MCH 24.7 (*)    RDW 16.4 (*)    Platelets 599 (*)    Neutro Abs 8.2 (*)    All other components within normal limits  RESP PANEL BY RT-PCR (RSV, FLU A&B, COVID)  RVPGX2  HCG, QUANTITATIVE, PREGNANCY  LIPASE, BLOOD  TROPONIN T, HIGH SENSITIVITY    EKG: EKG Interpretation Date/Time:  Tuesday July 10 2024 18:12:57 EST Ventricular Rate:  74 PR Interval:  141 QRS Duration:  94 QT Interval:  403 QTC Calculation: 448 R Axis:   67  Text Interpretation: Sinus rhythm Confirmed by Dasie Faden ([id number]) on 07/10/2024 8:37:54 PM  Radiology: [facility] Chest Portable 1 View Result Date: 07/10/2024 CLINICAL DATA:  Shortness of breath and chest pain. EXAM: PORTABLE CHEST 1 VIEW COMPARISON:  Chest radiograph dated 12/02/2019. FINDINGS:  The heart size and mediastinal contours are within normal limits. Both lungs are clear. The visualized skeletal structures are unremarkable. IMPRESSION: No active disease. Electronically Signed   By: Vanetta Chou M.D.   On: 07/10/2024 18:21    Procedures   Medications Ordered in the ED  ondansetron  (ZOFRAN -ODT) disintegrating tablet 4 mg (4 mg Oral Given 07/10/24 1838)  promethazine  (PHENERGAN ) tablet 12.5 mg (12.5 mg Oral Given 07/10/24 2037)    23 y.o. female presents to the ED with complaints of nausea, vomiting, shortness of breath, chest pain,The differential diagnosis includes but not limited to ACS, asthma exacerbation, gastritis, COVID flu RSV, other viral illness (Ddx)  On arrival pt is nontoxic, vitals unremarkable. Exam significant for reported nausea with vomiting in the ED.  Patient denies any shortness of breath in the ED patient  I ordered medication Zofran  and Phenergan  for nausea  Lab Tests:  I Ordered, reviewed, and interpreted labs, which included: CMP, CBC, lipase, hCG, aspiratory panel, troponin.  CMP, lipase, troponin, respiratory panel, hCG unremarkable. CBC remarkable for mildly elevated white count, decreased hemoglobin at baseline for patient in the presence of known anemia.  Imaging Studies ordered:  I ordered imaging studies which included chest x-ray which was negative for any acute process  ED Course:   Patient is PERC negative.  Patient is not currently reporting any shortness of breath currently but does report some nausea and vomiting.  There is no abdominal pain to palpation throughout all quadrants.  Patient reports chest pain feels like chest tightness that has been present since Sunday, and does not radiate, worse with deep inspirations or coughing.  It is reproducible with palpation to the chest wall.  Patient had an episode of vomiting prior to initial evaluation.  Patient was given Zofran  for nausea.  Troponin was negative and patient reports  chest tightness occurred Sunday.  The pain is reproducible with palpation and deep inspiration/cough.  With symptoms consistent with chest wall pain and onset Sunday repeat troponin discontinued.  EKG was nonischemic.  CMP and lipase were unremarkable  abdominal exam was unremarkable.  Imaging will not be pursued at this time.  Patient had an episode of vomiting after Zofran .  Patient was trialed on Phenergan  for relief of nausea.  On reassessment patient denies any chest pain or shortness of breath.  Abdominal exam is unchanged.  Patient was able to keep down food and liquids after Phenergan .  After discussion with the patient it was advised to follow-up with primary care and patient will be sent home with antiemetics.  Patient was given strict return precautions and agreed with treatment plan.   Portions of this note were generated with Scientist, clinical (histocompatibility and immunogenetics). Dictation errors may occur despite best attempts at proofreading.   Final diagnoses:  Nausea and vomiting, unspecified vomiting type    ED Discharge Orders          Ordered    promethazine  (PHENERGAN ) 25 MG tablet  Every 6 hours PRN        07/10/24 2204    dicyclomine  (BENTYL ) 20 MG tablet  2 times daily        07/10/24 2204               Myriam Fonda GORMAN DEVONNA 07/10/24 2304    Dasie Faden, MD 07/15/24 1745

## 2024-07-10 NOTE — Discharge Instructions (Addendum)
 Lab work and exam were reassuring today.  I have sent you in Bentyl  to help with stomach cramping and Phenergan  to help with nausea.  Please follow-up with your primary care for further evaluation and reassessment.  If you experience new or worsening symptoms please return to ED for further evaluation.  Example of symptoms to monitor for would be not being able to keep down any food or water due to nausea and vomiting, severe abdominal pain, chest pain or shortness of breath.

## 2024-08-09 ENCOUNTER — Encounter: Payer: Self-pay | Admitting: Gastroenterology

## 2024-08-09 ENCOUNTER — Ambulatory Visit: Admitting: Gastroenterology

## 2024-08-09 VITALS — BP 122/78 | HR 80 | Ht 63.0 in | Wt 186.4 lb

## 2024-08-09 DIAGNOSIS — K5903 Drug induced constipation: Secondary | ICD-10-CM | POA: Diagnosis not present

## 2024-08-09 DIAGNOSIS — D500 Iron deficiency anemia secondary to blood loss (chronic): Secondary | ICD-10-CM

## 2024-08-09 DIAGNOSIS — T454X5A Adverse effect of iron and its compounds, initial encounter: Secondary | ICD-10-CM

## 2024-08-09 DIAGNOSIS — R109 Unspecified abdominal pain: Secondary | ICD-10-CM

## 2024-08-09 DIAGNOSIS — K5900 Constipation, unspecified: Secondary | ICD-10-CM

## 2024-08-09 DIAGNOSIS — R1084 Generalized abdominal pain: Secondary | ICD-10-CM

## 2024-08-09 DIAGNOSIS — R112 Nausea with vomiting, unspecified: Secondary | ICD-10-CM

## 2024-08-09 NOTE — Patient Instructions (Signed)
 You have been scheduled for an abdominal ultrasound at [facility] (1st floor of hospital) on Tuesday, 08-14-24 at 8:00am. Please arrive 15 minutes prior to your appointment for registration. Make certain not to have anything to eat or drink 6 hours prior to your appointment. Should you need to reschedule your appointment, please contact radiology at [phone]. This test typically takes about 30 minutes to perform.   Please go to the lab in the basement of our building to have lab work done as you leave today. Hit B for basement when you get on the elevator.  When the doors open the lab is on your left.  We will call you with the results. Thank you.  Continue Zofran  as needed  Continue to increase fiber in your diet and drink plenty of water.  Thank you for entrusting me with your care and for choosing [name], [name], [name]    _______________________________________________________  If your blood pressure at your visit was 140/90 or greater, please contact your primary care physician to follow up on this.  _______________________________________________________  If you are age 53 or older, your body mass index should be between 23-30. Your Body mass index is 33.02 kg/m. If this is out of the aforementioned range listed, please consider follow up with your Primary Care Provider.  If you are age 29 or younger, your body mass index should be between 19-25. Your Body mass index is 33.02 kg/m. If this is out of the aformentioned range listed, please consider follow up with your Primary Care Provider.   ________________________________________________________  The [facility] providers would like to encourage you to use MYCHART to communicate with providers for non-urgent requests or questions.  Due to long hold times on the telephone, sending your provider a message by [facility] may be a faster and more efficient way to get a response.  Please allow 48 business hours for  a response.  Please remember that this is for non-urgent requests.  _______________________________________________________  [facility] is using a team-based approach to care.  Your team is made up of your doctor and two to three APPS. Our APPS (Nurse Practitioners and Physician Assistants) work with your physician to ensure care continuity for you. They are fully qualified to address your health concerns and develop a treatment plan. They communicate directly with your gastroenterologist to care for you. Seeing the Advanced Practice Practitioners on your physician's team can help you by facilitating care more promptly, often allowing for earlier appointments, access to diagnostic testing, procedures, and other specialty referrals.

## 2024-08-09 NOTE — Progress Notes (Unsigned)
 "  Mackenzie Berger [id number] [birth year]   Chief Complaint:  Referring Provider: Ileen Rosaline NOVAK, NP Primary GI MD: [place]  HPI: Mackenzie Berger is a 24 y.o. female with past medical history of iron deficiency anemia, asthma, HTN who presents today for a complaint of nausea, abdominal pain, diarrhea.    Patient seen in the ED 06/23/2024 for abdominal pain, nausea, vomiting ongoing for 3 weeks.  Reported dizziness.  Denied alcohol or illicit drug use.  History of anemia and takes iron supplements. Pregnancy test negative.  Normal lipase.  Unremarkable LFTs.  No electrolyte abnormalities.  Hemoglobin low but stable at 10.7.  She was given IV Zofran , fluid bolus, fluconazole  for possible vulvovaginal candidiasis.  Advised to follow-up with PCP and schedule appointment with GI.  Seen again in ED 07/10/2024 for nausea and vomiting and associated congestion, shortness of breath, chest tightness.  Reported history of asthma and had to use her inhaler twice a day which improved shortness of breath mildly.  Main concern was nausea and vomiting.  CMP, lipase, troponin, respiratory panel, hCG unremarkable.  CBC remarkable for mildly elevated white count, decreased hemoglobin at baseline and presence of known anemia.  No abdominal pain to palpation throughout all quadrants.  Chest pain reproducible with palpation and deep inspiration/cough.  EKG nonischemic.  Imaging not pursued.  Patient was able to keep down food and liquids after Phenergan .  Advised to follow-up with PCP and was sent home with antiemetics.  Also was given Bentyl .  Labs 07/10/2024: Hemoglobin 10.6, WBC 10.7, platelets 599, unremarkable CMP, negative troponin, normal lipase, negative hCG.    It is LLQ pain better with Bentyl ? Menorrhagia? Followed up yet with PCP?   Discussed the use of AI scribe software for clinical note transcription with the patient, who gave verbal consent to proceed.  History of Present Illness Mackenzie  Berger is a 24 year old female with iron deficiency anemia due to heavy menstrual bleeding who presents for evaluation of chronic nausea, vomiting, and abdominal pain.  Nausea and vomiting - Intermittent nausea present for several years, requiring Zofran  and occasional Phenergan  - Worsening of symptoms in November-December 2025 to daily nausea and vomiting a few times per week, resulting in multiple emergency department visits - Vomiting has improved; no current vomiting - Intermittent nausea persists, described as coming in waves and occurring randomly without clear relation to meals or activity - Symptoms have impacted ability to work, causing her to call out or leave early - No fever, chills, or blood in vomit - No abdominal imaging since a sonogram at age 35 - No upper endoscopy performed  Abdominal pain - Intermittent, crampy pain in both upper and lower abdomen - Pain lasts seconds to minutes and resolves spontaneously - No clear relation to bowel movements - No current abdominal pain - Dicyclomine  has not been used  Constipation and bowel habits - Constipation present, attributed to oral iron supplementation - Bowel movements every two days - Improvement in constipation with increased dietary fiber - No diarrhea - Occasional black spots in stool, a new finding - No blood in stool or melena  Menstrual irregularities and heavy bleeding - Heavy, irregular menstrual cycles historically - Cycles have been regular for the past two months - Iron deficiency anemia attributed to heavy menstrual bleeding - Abdominal sonogram at age 35 for heavy bleeding was normal - Follows with OB GYN - Declined birth control due to prior side effects  Gastroesophageal reflux symptoms - Occasional heartburn or acid reflux a  couple times per week  Medication history - Oral iron supplementation for anemia - Two antidepressants started in late August or September 2025 - No other new  medications  Substance use and infectious exposure - No use of tobacco, alcohol, or marijuana - No similar symptoms in household contacts      Previous GI Procedures/Imaging      Past Medical History:  Diagnosis Date   Anemia    Asthma    Heart abnormality    per mom at times her heart will race-when she was born she had be on digoxin-lately it is bothering her more-we are from [place] and don't have a cardiologist yet.   Hypertension, unspecified type    Iron deficiency anemia due to chronic blood loss    Moderate persistent asthma    MRSA (methicillin resistant Staphylococcus aureus)     History reviewed. No pertinent surgical history.  Current Outpatient Medications  Medication Sig Dispense Refill   albuterol  (PROVENTIL  HFA;VENTOLIN  HFA) 108 (90 Base) MCG/ACT inhaler Inhale 2 puffs into the lungs every 6 (six) hours as needed for wheezing or shortness of breath.     albuterol  (PROVENTIL ) (2.5 MG/3ML) 0.083% nebulizer solution Take 2.5 mg by nebulization every 6 (six) hours as needed for wheezing or shortness of breath.     Ascorbic Acid (VITAMIN C) 1000 MG tablet 1 tablet Orally Once a day for 30 day(s)     cetirizine  (ZYRTEC  ALLERGY) 10 MG tablet Take 1 tablet (10 mg total) by mouth daily as needed for allergies or rhinitis. 15 tablet 0   famotidine  (PEPCID ) 20 MG tablet Take 1 tablet (20 mg total) by mouth 2 (two) times daily. 30 tablet 0   ferrous sulfate  325 (65 FE) MG tablet Take 1 tablet (325 mg total) by mouth 3 (three) times daily with meals. 90 tablet 3   fluticasone -salmeterol (ADVAIR) 250-50 MCG/ACT AEPB Inhale 1 puff into the lungs in the morning and at bedtime. 60 each 0   IBU 600 MG tablet TAKE ONE (1) TABLET BY MOUTH EVERY 6 HOURS AS NEEDED (Patient taking differently: Take 600 mg by mouth every 6 (six) hours as needed for fever, headache or mild pain (pain score 1-3).) 30 tablet 1   metoprolol tartrate (LOPRESSOR) 25 MG tablet Take 25 mg by mouth 2 (two) times  daily.     montelukast (SINGULAIR) 10 MG tablet Take 10 mg by mouth at bedtime.     ondansetron  (ZOFRAN -ODT) 4 MG disintegrating tablet Take 1 tablet (4 mg total) by mouth every 8 (eight) hours as needed for nausea or vomiting. 20 tablet 0   promethazine  (PHENERGAN ) 25 MG tablet Take 1 tablet (25 mg total) by mouth every 6 (six) hours as needed for nausea or vomiting. 30 tablet 0   spironolactone (ALDACTONE) 50 MG tablet Take 50 mg by mouth daily.     tranexamic acid  (LYSTEDA ) 650 MG TABS tablet TAKE TWO TABLETS BY MOUTH THREE TIMES A DAY . TAKE DURING MENSE FOR A MAXIMUM OF FIVE DAYS 30 tablet 5   VITAMIN D PO Take by mouth.     No current facility-administered medications for this visit.    Allergies as of 08/09/2024   (No Known Allergies)    Family History  Problem Relation Age of Onset   Asthma Mother    Diabetes Mother     Social History[1]   Review of Systems:    Constitutional: No weight loss, fever, chills, weakness or fatigue Eyes: No change in vision Ears,  Nose, Throat:  No change in hearing or congestion Skin: No rash or itching Cardiovascular: No chest pain, chest pressure or palpitations   Respiratory: No SOB or cough Gastrointestinal: See HPI and otherwise negative Genitourinary: No dysuria or change in urinary frequency Neurological: No headache, dizziness or syncope Musculoskeletal: No new muscle or joint pain Hematologic: No bleeding or bruising    Physical Exam:  Vital signs: BP 122/78   Pulse 80   Ht 5' 3 (1.6 m)   Wt 186 lb 6.4 oz (84.6 kg)   LMP 06/28/2024 (Approximate)   BMI 33.02 kg/m   Wt Readings from Last 3 Encounters:  08/09/24 186 lb 6.4 oz (84.6 kg)  06/22/23 178 lb (80.7 kg)  10/26/22 179 lb 12.8 oz (81.6 kg)     Constitutional: NAD, Well developed, Well nourished, alert and cooperative Head:  Normocephalic and atraumatic.  Eyes: No scleral icterus. Conjunctiva pink. Mouth: No oral lesions. Respiratory: Respirations even and  unlabored. Lungs clear to auscultation bilaterally.  No wheezes, crackles, or rhonchi.  Cardiovascular:  Regular rate and rhythm. No murmurs. No peripheral edema. Gastrointestinal:  Soft, nondistended, nontender. No rebound or guarding. Normal bowel sounds. No appreciable masses or hepatomegaly. Rectal:  Not performed.  Neurologic:  Alert and oriented x4;  grossly normal neurologically.  Skin:   Dry and intact without significant lesions or rashes. Psychiatric: Oriented to person, place and time. Demonstrates good judgement and reason without abnormal affect or behaviors.      Assessment/Plan:   Assessment & Plan Chronic nausea, vomiting, and abdominal pain Intermittent nausea, vomiting, and abdominal pain with recent exacerbation. Persistent nausea and occasional melena suggest possible upper GI bleeding. Differential includes gastritis, peptic ulcer disease, and medication side effects. No acute severe illness. She deferred invasive procedures. - Ordered H. pylori stool antigen. - Provided stool cards for fecal occult blood testing. - Ordered abdominal ultrasound. - Advised to continue increasing dietary fiber intake. - Deferred dicyclomine  due to infrequent cramping. - Discussed empiric trial of acid suppression therapy if H. pylori negative and symptoms persist. - Discussed upper endoscopy or colonoscopy if occult blood detected or symptoms worsen. - Scheduled follow-up after diagnostic testing.  Constipation due to iron supplementation Intermittent constipation likely due to oral iron supplementation, improved with increased dietary fiber. - Advised to continue increased dietary fiber intake. - Reinforced that oral iron supplementation may contribute to constipation.   Ultrasound abdomen  H. Pylori stool test  Continue increased fiber Continue zofran  prn Follow up 6 weeks  If sx persist without explanation: Consider PPI trial  If worsening abd pain consider CT If workup  unrevealing consider EGD   [name], PA-C [facility] 08/09/2024, 9:47 AM  Patient Care Team: Ileen Rosaline NOVAK, NP as PCP - General (Nurse Practitioner)       [1]  Social History Tobacco Use   Smoking status: Never    Passive exposure: Yes   Smokeless tobacco: Never   Tobacco comments:    mother and brothers smoke in the home  Vaping Use   Vaping status: Never Used  Substance Use Topics   Alcohol use: No   Drug use: No   "

## 2024-08-12 ENCOUNTER — Encounter: Payer: Self-pay | Admitting: Gastroenterology

## 2024-08-14 ENCOUNTER — Ambulatory Visit (HOSPITAL_COMMUNITY)

## 2024-08-23 ENCOUNTER — Ambulatory Visit: Payer: Self-pay | Admitting: Gastroenterology

## 2024-08-23 ENCOUNTER — Ambulatory Visit (HOSPITAL_COMMUNITY)
Admission: RE | Admit: 2024-08-23 | Discharge: 2024-08-23 | Disposition: A | Discharge status: Home / Self Care | Source: Ambulatory Visit | Attending: Gastroenterology | Admitting: Gastroenterology

## 2024-08-23 ENCOUNTER — Other Ambulatory Visit

## 2024-08-23 DIAGNOSIS — R112 Nausea with vomiting, unspecified: Secondary | ICD-10-CM | POA: Diagnosis present

## 2024-08-23 DIAGNOSIS — N289 Disorder of kidney and ureter, unspecified: Secondary | ICD-10-CM

## 2024-08-23 DIAGNOSIS — R1084 Generalized abdominal pain: Secondary | ICD-10-CM | POA: Diagnosis present

## 2024-08-25 LAB — H. PYLORI ANTIGEN, STOOL: H pylori Ag, Stl: NEGATIVE

## 2024-09-03 ENCOUNTER — Ambulatory Visit (HOSPITAL_COMMUNITY): Admission: RE | Admit: 2024-09-03 | Source: Ambulatory Visit

## 2024-09-11 ENCOUNTER — Ambulatory Visit (HOSPITAL_COMMUNITY)

## 2024-09-20 ENCOUNTER — Ambulatory Visit: Admitting: Gastroenterology
